# Patient Record
Sex: Female | Born: 1980 | Hispanic: Yes | Marital: Married | Smoking: Never smoker
Health system: Southern US, Community
[De-identification: ages and names within clinical notes are randomized; demographics above are authoritative.]

## PROBLEM LIST (undated history)

## (undated) DIAGNOSIS — O24419 Gestational diabetes mellitus in pregnancy, unspecified control: Secondary | ICD-10-CM

## (undated) DIAGNOSIS — Z789 Other specified health status: Secondary | ICD-10-CM

## (undated) DIAGNOSIS — Z8632 Personal history of gestational diabetes: Secondary | ICD-10-CM

## (undated) DIAGNOSIS — A6000 Herpesviral infection of urogenital system, unspecified: Secondary | ICD-10-CM

## (undated) DIAGNOSIS — Z87440 Personal history of urinary (tract) infections: Secondary | ICD-10-CM

## (undated) HISTORY — DX: Personal history of gestational diabetes: Z86.32

## (undated) HISTORY — DX: Personal history of urinary (tract) infections: Z87.440

## (undated) HISTORY — DX: Gestational diabetes mellitus in pregnancy, unspecified control: O24.419

## (undated) HISTORY — DX: Herpesviral infection of urogenital system, unspecified: A60.00

## (undated) HISTORY — PX: TUBAL LIGATION: SHX77

## (undated) HISTORY — DX: Other specified health status: Z78.9

---

## 2008-05-30 ENCOUNTER — Emergency Department: Payer: Self-pay | Admitting: Emergency Medicine

## 2014-05-12 ENCOUNTER — Emergency Department: Payer: Self-pay | Admitting: Emergency Medicine

## 2014-08-31 ENCOUNTER — Encounter: Payer: Self-pay | Admitting: Physician Assistant

## 2014-09-02 ENCOUNTER — Encounter: Payer: Self-pay | Admitting: Physician Assistant

## 2014-09-13 ENCOUNTER — Emergency Department: Payer: Self-pay | Admitting: Emergency Medicine

## 2014-10-03 ENCOUNTER — Encounter: Payer: Self-pay | Admitting: Physician Assistant

## 2018-03-05 ENCOUNTER — Ambulatory Visit
Admission: RE | Admit: 2018-03-05 | Discharge: 2018-03-05 | Disposition: A | Discharge status: Home / Self Care | Payer: Self-pay | Source: Ambulatory Visit | Attending: Physician Assistant | Admitting: Physician Assistant

## 2018-03-05 ENCOUNTER — Other Ambulatory Visit: Payer: Self-pay | Admitting: Physician Assistant

## 2018-03-05 DIAGNOSIS — O09511 Supervision of elderly primigravida, first trimester: Secondary | ICD-10-CM | POA: Insufficient documentation

## 2018-03-05 DIAGNOSIS — O26851 Spotting complicating pregnancy, first trimester: Secondary | ICD-10-CM | POA: Insufficient documentation

## 2018-03-05 DIAGNOSIS — Z3A01 Less than 8 weeks gestation of pregnancy: Secondary | ICD-10-CM | POA: Insufficient documentation

## 2018-12-24 DIAGNOSIS — A6000 Herpesviral infection of urogenital system, unspecified: Secondary | ICD-10-CM | POA: Insufficient documentation

## 2020-02-03 IMAGING — US US OB < 14 WEEKS - US OB TV
1 series · 13 of 28 positions shown · non-contrast
Comparison: None.

CLINICAL DATA: Spotting complicating pregnancy in first trimester.
Gestational age by LMP is 9 weeks 5 days.

EXAM:
OBSTETRIC <14 WK US AND TRANSVAGINAL OB US
TECHNIQUE: Both transabdominal and transvaginal ultrasound examinations were
performed for complete evaluation of the gestation as well as the
maternal uterus, adnexal regions, and pelvic cul-de-sac.
Transvaginal technique was performed to assess early pregnancy.

[Series 1: us ob < 14 weeks - us ob tv · 89 acquisitions, 13 frames shown]
[im 4/89]
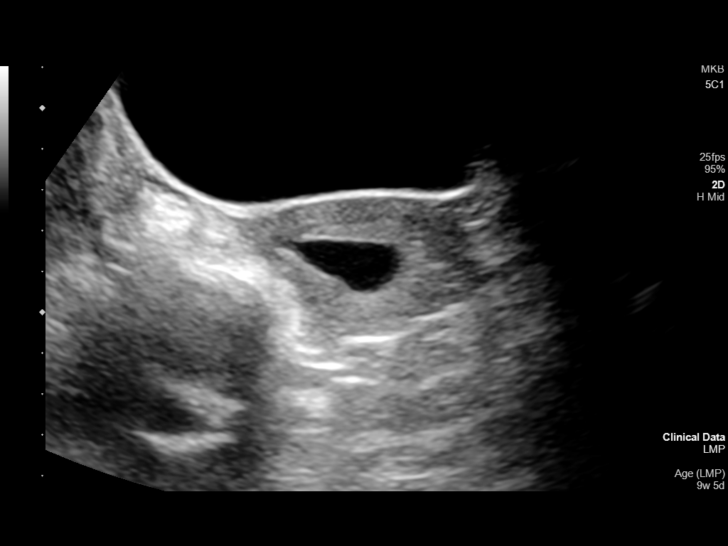
[im 10/89]
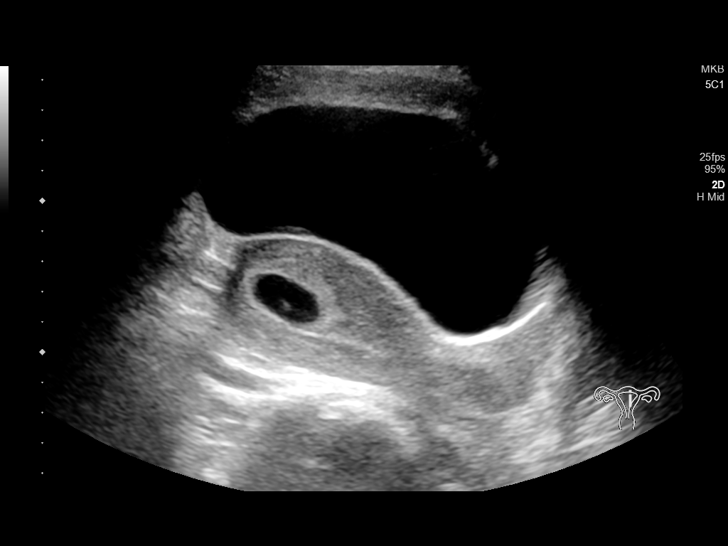
[im 17/89]
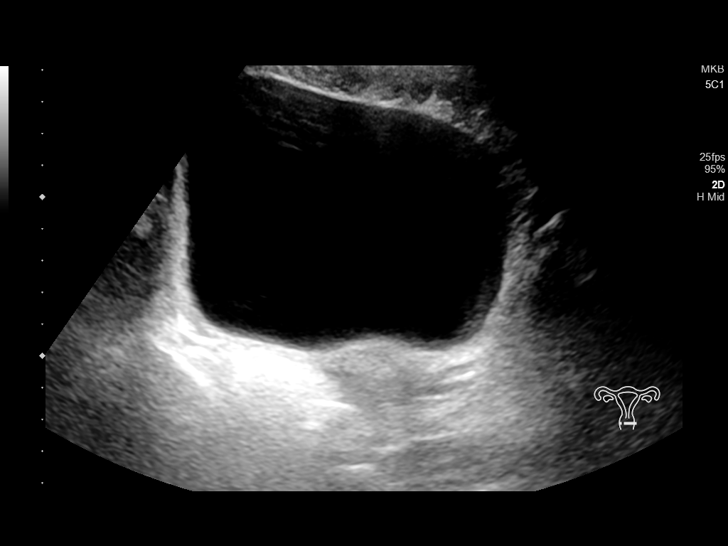
[im 23/89]
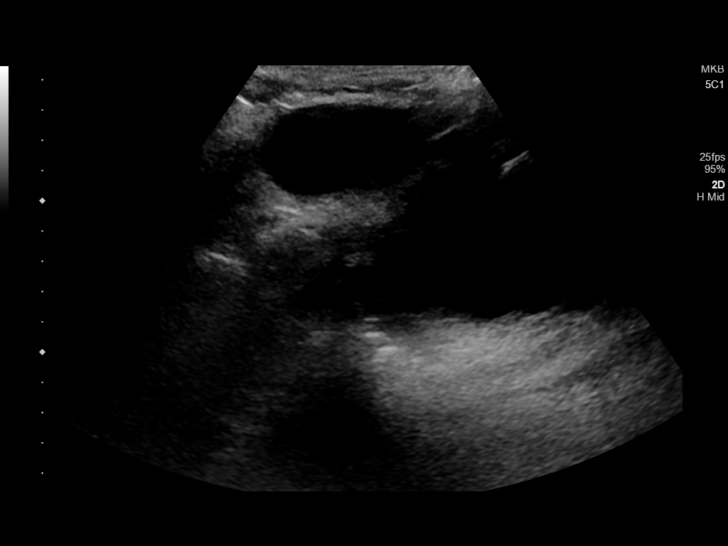
[im 30/89]
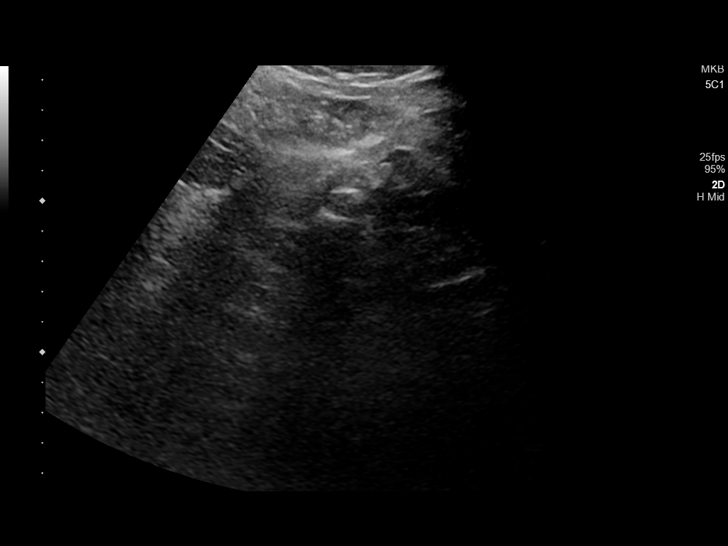
[im 36/89]
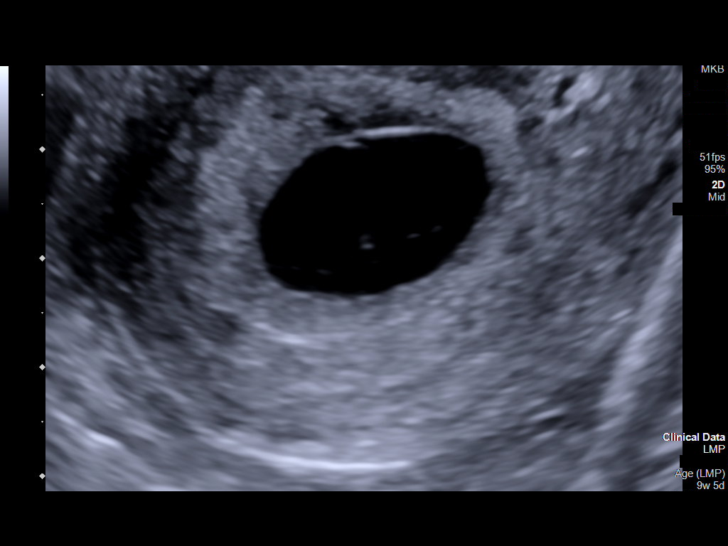
[im 46/89]
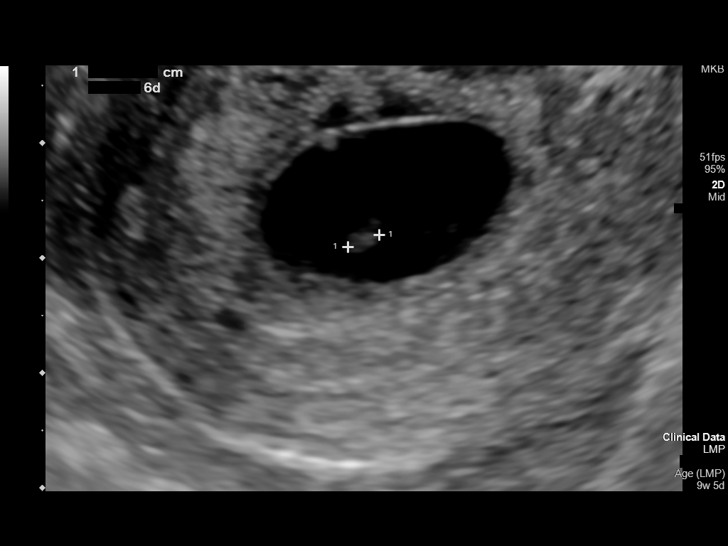
[im 53/89]
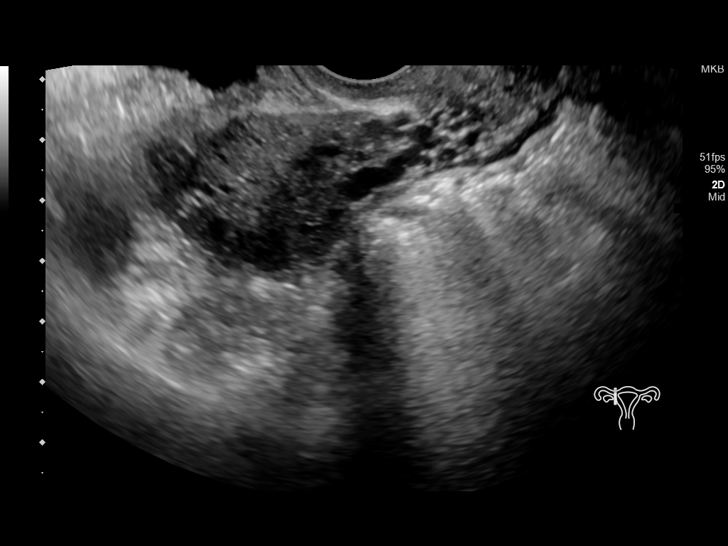
[im 59/89]
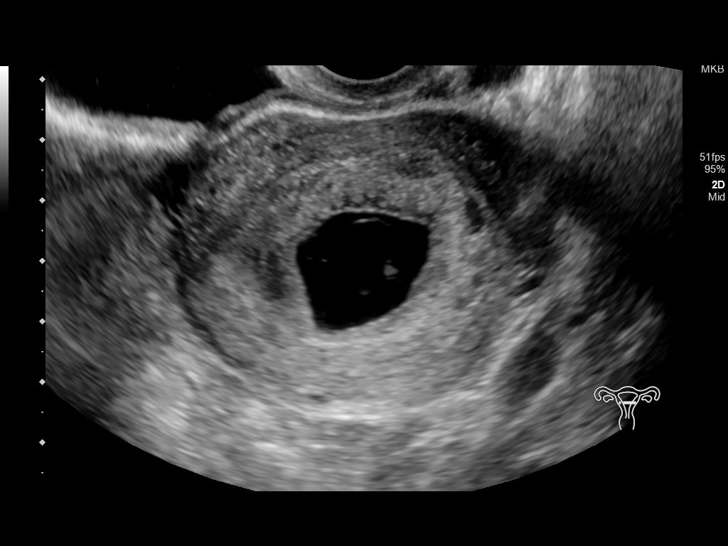
[im 66/89]
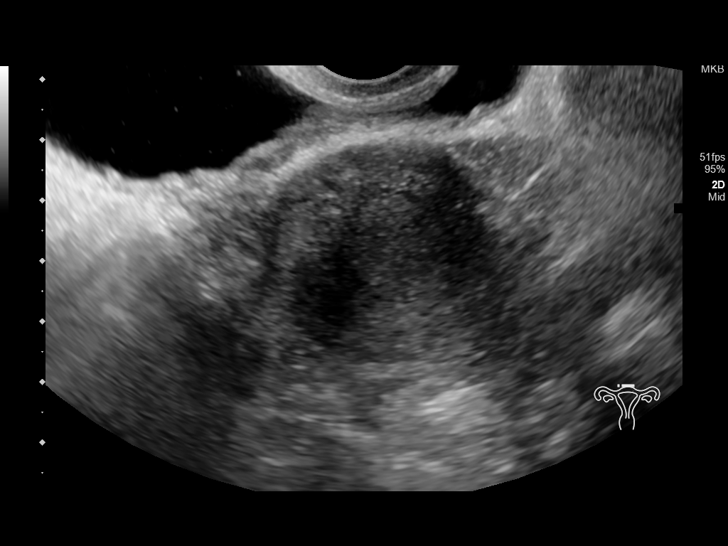
[im 72/89]
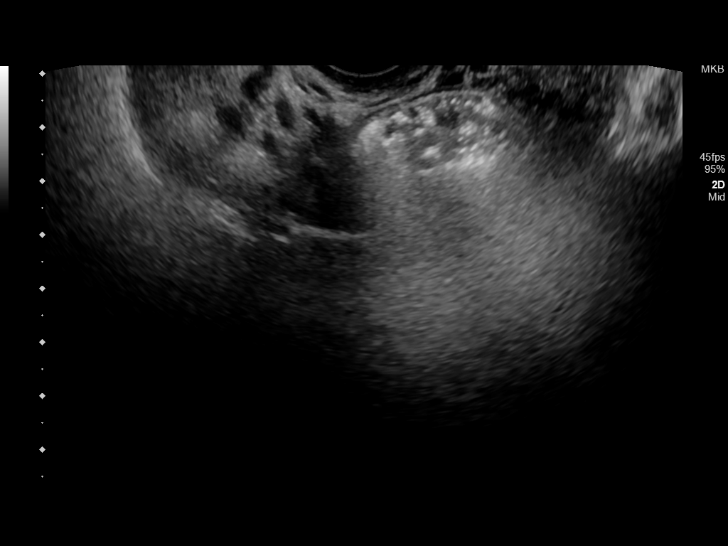
[im 79/89]
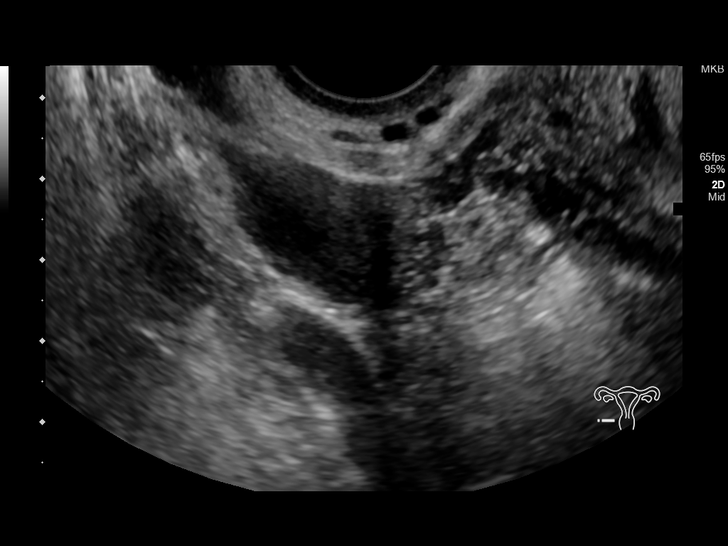
[im 85/89]
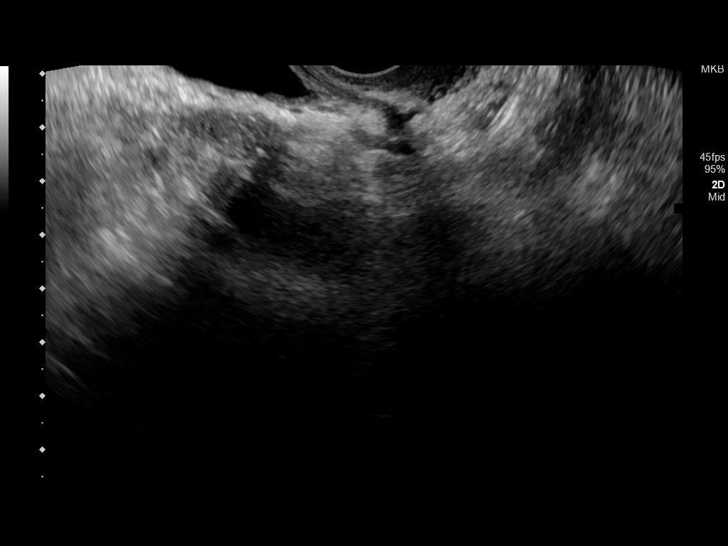

[13 of 28 positions shown; findings below may reference images not displayed]

FINDINGS: Intrauterine gestational sac: Single

Yolk sac:  Present

Embryo:  Present

Cardiac Activity: Not detected

CRL: 2.9 mm   5 w   6 d

Subchorionic hemorrhage:  None visualized.

Maternal uterus/adnexae: Uterus is of normal size and echotexture.
Trace fluid is present within the cervix. Cervix remains elongated.
A probable corpus luteal cyst is present in the right ovary
measuring up to 1.9 cm. The left ovary is not located. No
significant free fluid is present.
IMPRESSION: 1. Single intrauterine pregnancy with measurements suggesting an
estimated gestational age of 5 weeks and 6 days.
2. This is discordant from the estimated gestational age based on
last menstrual.
3. The differential diagnosis includes normal early pregnancy versus
missed abortion. Recommend follow-up quantitative B-HCG levels and
follow-up US in 14 days to confirm and assess viability. This
recommendation follows SRU consensus guidelines: Diagnostic Criteria
for Nonviable Pregnancy Early in the First Trimester. N Engl J Med

## 2020-04-15 ENCOUNTER — Emergency Department: Payer: BC Managed Care – PPO

## 2020-04-15 ENCOUNTER — Emergency Department
Admission: EM | Admit: 2020-04-15 | Discharge: 2020-04-16 | Disposition: A | Discharge status: Home / Self Care | Payer: BC Managed Care – PPO | Attending: Emergency Medicine | Admitting: Emergency Medicine

## 2020-04-15 ENCOUNTER — Other Ambulatory Visit: Payer: Self-pay

## 2020-04-15 DIAGNOSIS — O200 Threatened abortion: Secondary | ICD-10-CM | POA: Insufficient documentation

## 2020-04-15 DIAGNOSIS — Z3A01 Less than 8 weeks gestation of pregnancy: Secondary | ICD-10-CM | POA: Diagnosis not present

## 2020-04-15 DIAGNOSIS — R1030 Lower abdominal pain, unspecified: Secondary | ICD-10-CM | POA: Insufficient documentation

## 2020-04-15 DIAGNOSIS — Z2913 Encounter for prophylactic Rho(D) immune globulin: Secondary | ICD-10-CM | POA: Diagnosis not present

## 2020-04-15 DIAGNOSIS — O209 Hemorrhage in early pregnancy, unspecified: Secondary | ICD-10-CM | POA: Diagnosis present

## 2020-04-15 LAB — CBC
HCT: 35.7 % — ABNORMAL LOW (ref 36.0–46.0)
Hemoglobin: 12.3 g/dL (ref 12.0–15.0)
MCH: 30.6 pg (ref 26.0–34.0)
MCHC: 34.5 g/dL (ref 30.0–36.0)
MCV: 88.8 fL (ref 80.0–100.0)
Platelets: 313 10*3/uL (ref 150–400)
RBC: 4.02 MIL/uL (ref 3.87–5.11)
RDW: 12.9 % (ref 11.5–15.5)
WBC: 10 10*3/uL (ref 4.0–10.5)
nRBC: 0 % (ref 0.0–0.2)

## 2020-04-15 LAB — BASIC METABOLIC PANEL
Anion gap: 7 (ref 5–15)
BUN: 16 mg/dL (ref 6–20)
CO2: 25 mmol/L (ref 22–32)
Calcium: 8.7 mg/dL — ABNORMAL LOW (ref 8.9–10.3)
Chloride: 104 mmol/L (ref 98–111)
Creatinine, Ser: 0.69 mg/dL (ref 0.44–1.00)
GFR calc Af Amer: >60 mL/min (ref >60)
GFR calc non Af Amer: >60 mL/min (ref >60)
Glucose, Bld: 118 mg/dL — ABNORMAL HIGH (ref 70–99)
Potassium: 4 mmol/L (ref 3.5–5.1)
Sodium: 136 mmol/L (ref 135–145)

## 2020-04-15 LAB — HCG, QUANTITATIVE, PREGNANCY: hCG, Beta Chain, Quant, S: 14921 m[IU]/mL — ABNORMAL HIGH (ref <5)

## 2020-04-15 LAB — POCT PREGNANCY, URINE: Preg Test, Ur: POSITIVE — AB

## 2020-04-15 LAB — ABO/RH: ABO/RH(D): O NEG

## 2020-04-15 LAB — ANTIBODY SCREEN: Antibody Screen: NEGATIVE

## 2020-04-15 MED ORDER — RHO D IMMUNE GLOBULIN 1500 UNIT/2ML IJ SOSY
300.0000 ug | PREFILLED_SYRINGE | Freq: Once | INTRAMUSCULAR | Status: AC
  Administered 2020-04-15: 300 ug via INTRAMUSCULAR
  Filled 2020-04-15: qty 2

## 2020-04-15 NOTE — ED Notes (Signed)
POC pregnancy test positive.

## 2020-04-15 NOTE — ED Triage Notes (Signed)
First RN Note: Pt presents to ED via POV, states had + at home pregnancy test. Pt's SO states that have opposite RH factors and she "would need the shot" if she is.

## 2020-04-15 NOTE — ED Triage Notes (Addendum)
Pt ambulatory from triage with steady gait. Pt reports that she thinks she may be pregnant because she took 3 tests that were positive. Pt reports pinkish vaginal fluid that resembles blood that occurred today accompanied with abdominal pain. LMP 02/22/20

## 2020-04-15 NOTE — ED Provider Notes (Signed)
[facility] Emergency Department Provider Note ____________________________________________   First MD Initiated Contact with Patient 04/15/20 2030     (approximate)  I have reviewed the triage vital signs and the nursing notes.   HISTORY  Chief Complaint Vaginal Bleeding    HPI Tonesha Tsou is a 39 y.o. female G3P1 at approximately 7 weeks by dates who presents with vaginal bleeding today, acute onset, described as pinkish blood and a relatively small amount.  She has mild associated lower abdominal pain.  She denies any weakness or lightheadedness, nausea or vomiting, or fever.  History reviewed. No pertinent past medical history.  There are no problems to display for this patient.     Prior to Admission medications   Not on File    Allergies Patient has no allergy information on record.  History reviewed. No pertinent family history.  Social History Social History   Tobacco Use  . Smoking status: Not on file  Substance Use Topics  . Alcohol use: Not on file  . Drug use: Not on file    Review of Systems  Constitutional: No fever. Eyes: No redness. ENT: No sore throat. Cardiovascular: Denies chest pain. Respiratory: Denies shortness of breath. Gastrointestinal: No vomiting. Genitourinary: Positive for vaginal bleeding. Musculoskeletal: Negative for back pain. Skin: Negative for rash. Neurological: Negative for headache.   ____________________________________________   PHYSICAL EXAM:  VITAL SIGNS: ED Triage Vitals  Enc Vitals Group     BP 04/15/20 1724 (!) 121/53     Pulse Rate 04/15/20 1724 74     Resp 04/15/20 1724 18     Temp 04/15/20 1724 98.4 F (36.9 C)     Temp Source 04/15/20 1724 Oral     SpO2 04/15/20 1724 100 %     Weight 04/15/20 1722 114 lb (51.7 kg)     Height 04/15/20 1722 4\' 6"  (1.372 m)     Head Circumference --      Peak Flow --      Pain Score 04/15/20 1722 2     Pain Loc --      Pain  Edu? --      Excl. in [place]? --     Constitutional: Alert and oriented. Well appearing and in no acute distress. Eyes: Conjunctivae are normal.  Head: Atraumatic. Nose: No congestion/rhinnorhea. Mouth/Throat: Mucous membranes are moist.   Neck: Normal range of motion.  Cardiovascular: Normal rate, regular rhythm.  Good peripheral circulation. Respiratory: Normal respiratory effort.  No retractions.  Gastrointestinal: Soft and nontender. No distention.  Genitourinary: No CVA tenderness. Musculoskeletal: Extremities warm and well perfused.  Neurologic:  Normal speech and language. No gross focal neurologic deficits are appreciated.  Skin:  Skin is warm and dry. No rash noted. Psychiatric: Mood and affect are normal. Speech and behavior are normal.  ____________________________________________   LABS (all labs ordered are listed, but only abnormal results are displayed)  Labs Reviewed  CBC - Abnormal; Notable for the following components:      Result Value   HCT 35.7 (*)    All other components within normal limits  BASIC METABOLIC PANEL - Abnormal; Notable for the following components:   Glucose, Bld 118 (*)    Calcium 8.7 (*)    All other components within normal limits  HCG, QUANTITATIVE, PREGNANCY - Abnormal; Notable for the following components:   hCG, Beta Chain, Quant, S 14,921 (*)    All other components within normal limits  POCT PREGNANCY, URINE - Abnormal; Notable  for the following components:   Preg Test, Ur POSITIVE (*)    All other components within normal limits  POC URINE PREG, ED  ABO/RH  ANTIBODY SCREEN  RHOGAM INJECTION   ____________________________________________  EKG   ____________________________________________  RADIOLOGY  US pelvis: Gestational sac with no yolk sac or fetal pole  ____________________________________________   PROCEDURES  Procedure(s) performed: No  Procedures  Critical Care performed:  No ____________________________________________   INITIAL IMPRESSION / ASSESSMENT AND PLAN / ED COURSE  Pertinent labs & imaging results that were available during my care of the patient were reviewed by me and considered in my medical decision making (see chart for details).  39 year old female G3P1 at 7 weeks by LMP presents with vaginal bleeding today associated with some crampy lower abdominal pain.  On exam the patient is well-appearing and her vital signs are normal.  The abdomen is soft and nontender.  Initial lab work-up is unremarkable.  The patient has a positive urine pregnancy.  We will obtain a quantitative hCG.  She is Rh negative.  Differential includes threatened or inevitable miscarriage versus less likely ectopic.  ----------------------------------------- 11:25 PM on 04/15/2020 -----------------------------------------  Ultrasound shows gestational sac with no yolk sac or fetal pole, thus no definitive IUP.  The patient continues to appear comfortable, and there is no clinical evidence for ectopic.  I consulted Dr. Jean Rosenthal from OB/GYN who recommended that the patient follow-up with him early next week.  I ordered the RhoGam and I have counseled the patient on the results of the work-up and the follow-up plan.  I gave her thorough return precautions and she expressed understanding.  ____________________________________________   FINAL CLINICAL IMPRESSION(S) / ED DIAGNOSES  Final diagnoses:  Threatened miscarriage      NEW MEDICATIONS STARTED DURING THIS VISIT:  New Prescriptions   No medications on file     Note:  This document was prepared using Dragon voice recognition software and may include unintentional dictation errors.   Dionne Bucy, MD 04/15/20 2325

## 2020-04-15 NOTE — Discharge Instructions (Signed)
Call [facility] on Monday to schedule appointment as soon as possible next week.  Tell them you were seen in the ER and need follow-up.  In the meantime, return to the ER for new, worsening, or persistent severe bleeding, pain, weakness or lightheadedness, or any other new or worsening symptoms that concern you.

## 2020-04-17 LAB — RHOGAM INJECTION: Unit division: 0

## 2020-04-22 ENCOUNTER — Ambulatory Visit (INDEPENDENT_AMBULATORY_CARE_PROVIDER_SITE_OTHER): Payer: BC Managed Care – PPO | Admitting: Obstetrics and Gynecology

## 2020-04-22 ENCOUNTER — Other Ambulatory Visit: Payer: Self-pay

## 2020-04-22 ENCOUNTER — Encounter: Payer: Self-pay | Admitting: Obstetrics and Gynecology

## 2020-04-22 VITALS — BP 122/74 | Wt 117.0 lb

## 2020-04-22 DIAGNOSIS — O200 Threatened abortion: Secondary | ICD-10-CM

## 2020-04-22 NOTE — Progress Notes (Signed)
Obstetrics & Gynecology Office Visit   Chief Complaint  Patient presents with  . Follow-up  ER visit for vaginal bleeding in the setting of pregnancy  History of Present Illness: 39 y.o. G3P1011 female at about [redacted] weeks gestation.  She notes that she was pregnant and started bleeding one week ago. She has continued to bleed since that time.  She had a pelvic ultrasound that showed a gestational sac with no yolk sac nor fetal pole.  Her LMP was 02/22/2020, giving her an estimated gestational age of [redacted]w[redacted]d and an EDD of 11/28/2020.  Her bleeding is red and sometimes clots.  She notes a little bit of occasional mild cramping and lower back pain. She has continued to work.  She has a 57-month old son.  He was delivered by C-section.  G1 was a miscarriage at about 3 months gestation.  She had been using no form of contraception.  Her blood type is O negative. She received rhogam in the ER last week.  Her hCG at that time was 14,921.    Past Medical History:  Diagnosis Date  . No known health problems    Past Surgical History:  Procedure Laterality Date  . CESAREAN SECTION     Gynecologic History: Patient's last menstrual period was 02/22/2020.  Obstetric History: G3P1011  Family History  Problem Relation Age of Onset  . Breast cancer Neg Hx   . Diabetes Neg Hx   . Hypertension Neg Hx   . Miscarriages / Stillbirths Neg Hx   . Ovarian cancer Neg Hx     Social History   Socioeconomic History  . Marital status: Single    Spouse name: Not on file  . Number of children: Not on file  . Years of education: Not on file  . Highest education level: Not on file  Occupational History  . Not on file  Tobacco Use  . Smoking status: Never Smoker  . Smokeless tobacco: Never Used  Substance and Sexual Activity  . Alcohol use: Never  . Drug use: Never  . Sexual activity: Yes    Birth control/protection: None  Other Topics Concern  . Not on file  Social History Narrative  . Not on file    Social Determinants of Health   Financial Resource Strain:   . Difficulty of Paying Living Expenses:   Food Insecurity:   . Worried About Programme researcher, broadcasting/film/video in the Last Year:   . Barista in the Last Year:   Transportation Needs:   . Freight forwarder (Medical):   Marland Kitchen Lack of Transportation (Non-Medical):   Physical Activity:   . Days of Exercise per Week:   . Minutes of Exercise per Session:   Stress:   . Feeling of Stress :   Social Connections:   . Frequency of Communication with Friends and Family:   . Frequency of Social Gatherings with Friends and Family:   . Attends Religious Services:   . Active Member of Clubs or Organizations:   . Attends Banker Meetings:   Marland Kitchen Marital Status:   Intimate Partner Violence:   . Fear of Current or Ex-Partner:   . Emotionally Abused:   Marland Kitchen Physically Abused:   . Sexually Abused:    Allergies: No Known Allergies  Prior to Admission medications   Denies   Review of Systems  Constitutional: Negative.   HENT: Negative.   Eyes: Negative.   Respiratory: Negative.   Cardiovascular: Negative.   Gastrointestinal:  Negative.   Genitourinary: Negative.   Musculoskeletal: Positive for back pain. Negative for falls, joint pain, myalgias and neck pain.  Skin: Negative.   Neurological: Negative.   Psychiatric/Behavioral: Negative.      Physical Exam BP 122/74   Wt 117 lb (53.1 kg)   LMP 02/22/2020   BMI 28.21 kg/m  Patient's last menstrual period was 02/22/2020. Physical Exam Constitutional:      General: She is not in acute distress.    Appearance: Normal appearance.  HENT:     Head: Normocephalic and atraumatic.  Eyes:     General: No scleral icterus.    Conjunctiva/sclera: Conjunctivae normal.  Neurological:     General: No focal deficit present.     Mental Status: She is alert and oriented to person, place, and time.     Cranial Nerves: No cranial nerve deficit.  Psychiatric:        Mood and  Affect: Mood normal.        Behavior: Behavior normal.        Judgment: Judgment normal.     Female chaperone present for pelvic and breast  portions of the physical exam  Assessment: 39 y.o. G47P1011 female here for  1. Threatened abortion      Plan: Problem List Items Addressed This Visit    None    Visit Diagnoses    Threatened abortion    -  Primary   Relevant Orders   Beta hCG quant (ref lab)     Condolences were offered to the patient and her family.  I stressed that while emotionally difficult, that this did not occur because of an actions or inactions by the patient.  Somewhere between 10-20% of identified first trimester pregnancies will unfortunately end in miscarriage.  Given this relatively high incidence rate, further diagnostic testing such as chromosome analysis is generally not clinically relevant nor recommended.  Although the chromosomal abnormalities have been implicated at rates as high as 70% in some studies, these are generally random and do not infer and increased risk of recurrence with subsequent pregnancies.  However, 3 or more consecutive first trimester losses are relatively uncommon, and these patient generally do benefit from additional work up to determine a potential modifiable etiology.   We briefly discussed management options including expectant management, medical management, and surgical management as well as their relative success rates and complications. Approximately 80% of first trimester miscarriages will pass successfully but may require a time frame of up to 8 weeks (ACOG Practice Bulletin 150 May 2015 "Early Pregnancy Loss").  Medical management using of misoprostil administered every 3-hrs as needed for up to 3 doses speeds up the time frame to completion significantly, has literature supporting its use up to 63 days or [redacted]w[redacted]d gestation and results in a passage rate of 84-85% (ACOG Practice Bulletin 143 March 2014 "Medical Management of  First-Trimester Abortion").  Dilation and curettage has the highest rate of uterine evacuation, but carries with is operative cost, surgical and anesthetic risk.  While these risk are relatively small they nevertheless include infection, bleeding, uterine perforation, formation of uterine synechia, and in rare cases death.   We discussed repeat ultrasound and or trending HCG levels if the patient wishes to pursue these prior to making her decision.  Clinically I am confident of the diagnosis, but I do not want any doubts in the patient's mind regarding the plan of management she chooses to adopt.  I will allow the patient and her family to  discuss management options and she was advised to contact the office to arrange final disposition one she has made her decision or should she have any follow up questions for myself.    The patient is Rh negative and rhogam is therefore indicated and has already been given.    HCG drawn today to see the trend of what is happening in her pregnancy. Follow up will be based on this.   A total of 35 minutes were spent face-to-face with the patient as well as preparation, review, communication, and documentation during this encounter.    Prentice Docker, MD 04/22/2020 3:10 PM

## 2020-04-23 LAB — BETA HCG QUANT (REF LAB): hCG Quant: 14100 m[IU]/mL

## 2020-04-25 NOTE — Telephone Encounter (Signed)
Can wait

## 2020-04-26 ENCOUNTER — Other Ambulatory Visit: Payer: Self-pay | Admitting: Obstetrics and Gynecology

## 2020-04-26 DIAGNOSIS — O200 Threatened abortion: Secondary | ICD-10-CM

## 2020-04-28 ENCOUNTER — Other Ambulatory Visit: Payer: Self-pay

## 2020-04-28 ENCOUNTER — Ambulatory Visit (INDEPENDENT_AMBULATORY_CARE_PROVIDER_SITE_OTHER): Payer: BC Managed Care – PPO

## 2020-04-28 ENCOUNTER — Other Ambulatory Visit: Payer: Self-pay | Admitting: Obstetrics and Gynecology

## 2020-04-28 DIAGNOSIS — O200 Threatened abortion: Secondary | ICD-10-CM | POA: Diagnosis not present

## 2020-04-29 ENCOUNTER — Ambulatory Visit (INDEPENDENT_AMBULATORY_CARE_PROVIDER_SITE_OTHER): Payer: BC Managed Care – PPO | Admitting: Obstetrics and Gynecology

## 2020-04-29 ENCOUNTER — Encounter: Payer: Self-pay | Admitting: Obstetrics and Gynecology

## 2020-04-29 VITALS — BP 112/74 | Wt 117.0 lb

## 2020-04-29 DIAGNOSIS — O200 Threatened abortion: Secondary | ICD-10-CM | POA: Diagnosis not present

## 2020-04-29 NOTE — Progress Notes (Signed)
Gynecology Ultrasound Follow Up   Chief Complaint  Patient presents with  . Follow-up  U/s for dating/viability   History of Present Illness: Patient is a 39 y.o. female who presents today for ultrasound evaluation of the above .  Briefly: the patient is pregnant, LMP mid-March.  She had vaginal bleeding and wen to ER on 04/15/20 where she had a beta hCG of 14,900.  She is Rh negative and received rhogam. She had a pelvic ultrasound which showed on a yolk sac. She was seen here in clinic on 04/22/20.  She had a beta hCG of 14,100 on that date.  She presents today after having an ultrasound yesterday (results below). She has been having some intermittent bleeding with occasional clots.   Ultrasound demonstrates the following findings Adnexa: no masses seen  Uterus: anteverted with a gestational sac and possibly a yolk sac, but this appeared misshapen. No fetal pole noted.    Past Medical History:  Diagnosis Date  . No known health problems     Past Surgical History:  Procedure Laterality Date  . CESAREAN SECTION      Family History  Problem Relation Age of Onset  . Breast cancer Neg Hx   . Diabetes Neg Hx   . Hypertension Neg Hx   . Miscarriages / Stillbirths Neg Hx   . Ovarian cancer Neg Hx     Social History   Socioeconomic History  . Marital status: Single    Spouse name: Not on file  . Number of children: Not on file  . Years of education: Not on file  . Highest education level: Not on file  Occupational History  . Not on file  Tobacco Use  . Smoking status: Never Smoker  . Smokeless tobacco: Never Used  Substance and Sexual Activity  . Alcohol use: Never  . Drug use: Never  . Sexual activity: Yes    Birth control/protection: None  Other Topics Concern  . Not on file  Social History Narrative  . Not on file   Social Determinants of Health   Financial Resource Strain:   . Difficulty of Paying Living Expenses:   Food Insecurity:   . Worried About  Programme researcher, broadcasting/film/video in the Last Year:   . Barista in the Last Year:   Transportation Needs:   . Freight forwarder (Medical):   Marland Kitchen Lack of Transportation (Non-Medical):   Physical Activity:   . Days of Exercise per Week:   . Minutes of Exercise per Session:   Stress:   . Feeling of Stress :   Social Connections:   . Frequency of Communication with Friends and Family:   . Frequency of Social Gatherings with Friends and Family:   . Attends Religious Services:   . Active Member of Clubs or Organizations:   . Attends Banker Meetings:   Marland Kitchen Marital Status:   Intimate Partner Violence:   . Fear of Current or Ex-Partner:   . Emotionally Abused:   Marland Kitchen Physically Abused:   . Sexually Abused:     No Known Allergies  Prior to Admission medications   Denies    Physical Exam BP 112/74   Wt 117 lb (53.1 kg)   BMI 28.21 kg/m    General: NAD HEENT: normocephalic, anicteric Pulmonary: No increased work of breathing Extremities: no edema, erythema, or tenderness Neurologic: Grossly intact, normal gait Psychiatric: mood appropriate, affect full  Imaging Results US OB Transvaginal  Result Date: 04/29/2020  Patient Name: Necha Harries DOB: [birth year] MRN: [id number] ULTRASOUND REPORT Location: [facility] Date of Service: 04/28/2020 Indications:dating and bleeding in first trimester Findings: There is a questionable irregularly shaped gestational sac seen in the uterus measuring 11.0 x 7.4 x 6.1 mm. There is a questionable yolk sac measuring 3.1 mm. There is a septation seen within this cystic area. No fetal pole nor heartbeat is seen at this time. Right Ovary is normal in appearance. Left Ovary is normal appearance. Corpus luteal cyst:  Right ovary Survey of the adnexa demonstrates no adnexal masses. There is no free peritoneal fluid in the cul de sac. Endometrial measurement is 26.5 mm. Impression: There is a questionable irregularly shaped gestational sac with  a yolk sac. No fetal pole is seen at this time. Deanna Artis, RT The ultrasound images and findings were reviewed by me and I agree with the above report. Thomasene Mohair, MD, [facility], [facility] 04/29/2020 12:35 PM        Assessment: 39 y.o. G3P1011  1. Threatened abortion      Plan: Problem List Items Addressed This Visit    None    Visit Diagnoses    Threatened abortion    -  Primary   Relevant Orders   US OB Comp Less 14 Wks     Condolences were offered to the patient and her family.  I stressed that while emotionally difficult, that this did not occur because of an actions or inactions by the patient.  Somewhere between 10-20% of identified first trimester pregnancies will unfortunately end in miscarriage.  Given this relatively high incidence rate, further diagnostic testing such as chromosome analysis is generally not clinically relevant nor recommended.  Although the chromosomal abnormalities have been implicated at rates as high as 70% in some studies, these are generally random and do not infer and increased risk of recurrence with subsequent pregnancies.  However, 3 or more consecutive first trimester losses are relatively uncommon, and these patient generally do benefit from additional work up to determine a potential modifiable etiology.   We briefly discussed management options including expectant management, medical management, and surgical management as well as their relative success rates and complications. Approximately 80% of first trimester miscarriages will pass successfully but may require a time frame of up to 8 weeks (ACOG Practice Bulletin 150 May 2015 "Early Pregnancy Loss").  Medical management using of misoprostil administered every 3-hrs as needed for up to 3 doses speeds up the time frame to completion significantly, has literature supporting its use up to 63 days or [redacted]w[redacted]d gestation and results in a passage rate of 84-85% (ACOG  Practice Bulletin 143 March 2014 "Medical Management of First-Trimester Abortion").  Dilation and curettage has the highest rate of uterine evacuation, but carries with is operative cost, surgical and anesthetic risk.  While these risk are relatively small they nevertheless include infection, bleeding, uterine perforation, formation of uterine synechia, and in rare cases death.   We discussed repeat ultrasound and or trending HCG levels if the patient wishes to pursue these prior to making her decision.  Clinically I am confident of the diagnosis, but I do not want any doubts in the patient's mind regarding the plan of management she chooses to adopt.  I will allow the patient and her family to discuss management options and she was advised to contact the office to arrange final disposition one she has made her decision or should she have any follow up  questions for myself.    The patient is Rh negative and rhogam is therefore is indicated and has already been given.    She was offered an additional ultrasound in a couple of weeks as she would like to give her body a couple of weeks to pass the pregnancy. If still present, she would like to consider either a medication treatment or surgical.  A hospital-provided medical spanish interpreter was present throughout this visit.   A total of 30 minutes were spent face-to-face with the patient as well as preparation, review, communication, and documentation during this encounter.    Prentice Docker, MD, [facility], [facility] 04/29/2020 12:40 PM

## 2020-05-09 ENCOUNTER — Ambulatory Visit (INDEPENDENT_AMBULATORY_CARE_PROVIDER_SITE_OTHER): Payer: BC Managed Care – PPO

## 2020-05-09 ENCOUNTER — Other Ambulatory Visit: Payer: Self-pay

## 2020-05-09 ENCOUNTER — Ambulatory Visit (INDEPENDENT_AMBULATORY_CARE_PROVIDER_SITE_OTHER): Payer: BC Managed Care – PPO | Admitting: Obstetrics and Gynecology

## 2020-05-09 ENCOUNTER — Encounter: Payer: Self-pay | Admitting: Obstetrics and Gynecology

## 2020-05-09 VITALS — BP 118/74 | Wt 117.0 lb

## 2020-05-09 DIAGNOSIS — O020 Blighted ovum and nonhydatidiform mole: Secondary | ICD-10-CM | POA: Diagnosis not present

## 2020-05-09 DIAGNOSIS — O200 Threatened abortion: Secondary | ICD-10-CM

## 2020-05-09 MED ORDER — MISOPROSTOL 200 MCG PO TABS: ORAL_TABLET | ORAL | 0 refills | Status: DC

## 2020-05-09 MED ORDER — ONDANSETRON 4 MG PO TBDP: 4.0000 mg | ORAL_TABLET | Freq: Four times a day (QID) | ORAL | 0 refills | Status: DC | PRN

## 2020-05-09 NOTE — Progress Notes (Signed)
Gynecology Ultrasound Follow Up   Chief Complaint  Patient presents with  . Follow-up  Ultrasound for pregnancy viability  History of Present Illness: Patient is a 39 y.o. female who presents today for ultrasound evaluation of the above .  Ultrasound demonstrates the following findings Adnexa: no masses seen  Uterus: anteverted with small gestational sac, no fetal pole or yolk sac.  Additional: no abnormalities noted.  She notes some ongoing bleeding, then not much bleeding. Denies cramping.   Past Medical History:  Diagnosis Date  . No known health problems     Past Surgical History:  Procedure Laterality Date  . CESAREAN SECTION      Family History  Problem Relation Age of Onset  . Breast cancer Neg Hx   . Diabetes Neg Hx   . Hypertension Neg Hx   . Miscarriages / Stillbirths Neg Hx   . Ovarian cancer Neg Hx     Social History   Socioeconomic History  . Marital status: Single    Spouse name: Not on file  . Number of children: Not on file  . Years of education: Not on file  . Highest education level: Not on file  Occupational History  . Not on file  Tobacco Use  . Smoking status: Never Smoker  . Smokeless tobacco: Never Used  Substance and Sexual Activity  . Alcohol use: Never  . Drug use: Never  . Sexual activity: Yes    Birth control/protection: None  Other Topics Concern  . Not on file  Social History Narrative  . Not on file   Social Determinants of Health   Financial Resource Strain:   . Difficulty of Paying Living Expenses:   Food Insecurity:   . Worried About Charity fundraiser in the Last Year:   . Arboriculturist in the Last Year:   Transportation Needs:   . Film/video editor (Medical):   Marland Kitchen Lack of Transportation (Non-Medical):   Physical Activity:   . Days of Exercise per Week:   . Minutes of Exercise per Session:   Stress:   . Feeling of Stress :   Social Connections:   . Frequency of Communication with Friends and  Family:   . Frequency of Social Gatherings with Friends and Family:   . Attends Religious Services:   . Active Member of Clubs or Organizations:   . Attends Archivist Meetings:   Marland Kitchen Marital Status:   Intimate Partner Violence:   . Fear of Current or Ex-Partner:   . Emotionally Abused:   Marland Kitchen Physically Abused:   . Sexually Abused:     No Known Allergies  Prior to Admission medications   Denies    Physical Exam BP 118/74   Wt 117 lb (53.1 kg)   BMI 28.21 kg/m    General: NAD HEENT: normocephalic, anicteric Pulmonary: No increased work of breathing Extremities: no edema, erythema, or tenderness Neurologic: Grossly intact, normal gait Psychiatric: mood appropriate, affect full  Imaging Results US OB Comp Less 14 Wks  Result Date: 05/09/2020 Patient Name: Jyll Tomaro DOB: [birth year] MRN: [id number] ULTRASOUND REPORT Location: [facility] Date of Service: 05/09/2020 Indications:Threatened AB Findings: There is a single gestational sac seen measuring 8.2 mm. No fetal pole, heartbeat, or yolk sac is seen. The (U/S) EDD is not consistent with the clinically established EDD of 11/28/2020. Right Ovary is normal in appearance. Left Ovary is normal appearance. Corpus luteal cyst:  Right ovary Survey of the adnexa demonstrates  no adnexal masses. There is no free peritoneal fluid in the cul de sac. Impression: 1. There is a single gestational sac seen within the uterus. No fetal pole, heartbeat or yolk sac is seen. 2. This is consistent with a failed pregnancy.Deanna Artis, RT *Criteria from the Society of Radiologists in Ultrasound Multispecialty Consensus Conference on Early First Trimester Diagnosis of Miscarriage and Exclusion of a Viable Intrauterine Pregnancy, October 2012. The ultrasound images and findings were reviewed by me and I agree with the above report. Thomasene Mohair, MD, [facility], [facility] 05/09/2020 4:16 PM        Assessment: 39 y.o. G3P1011  1. Anembryonic pregnancy      Plan: Problem List Items Addressed This Visit    None    Visit Diagnoses    Anembryonic pregnancy    -  Primary   Relevant Medications   misoprostol (CYTOTEC) 200 MCG tablet   ondansetron (ZOFRAN ODT) 4 MG disintegrating tablet     Discussed ultrasound findings today.  She has an anembryonic pregnancy. Again reviewed options. She would like to try using medication to help pass the pregnancy.  Prescription provided along with instructions.  She will let me know if she still has a positive pregnancy test 2 weeks after taking this medication.   She would also like contraception. Will provide Rx for combined OCPs once she has completed her miscarriage.   Spanish language interpreter present (hospital provided medical spanish language interpreter).  A total of 25 minutes were spent face-to-face with the patient as well as preparation, review, communication, and documentation during this encounter.    Thomasene Mohair, MD, [facility], [facility] 05/09/2020 5:32 PM

## 2020-05-23 ENCOUNTER — Telehealth: Payer: Self-pay

## 2020-05-23 NOTE — Telephone Encounter (Signed)
Can you ask her if she took the medication once or if she also took a repeat dose?  I would recommend she complete two doses and if no response, we get an ultrasound to verify that the tissue hasn't passed.  Then, decide what to do from there.

## 2020-05-23 NOTE — Telephone Encounter (Signed)
Ariana Hall, can you call this pt. The triage message was in english, but not sure if someone else was calling for her. Chart states needs interpreter.

## 2020-05-23 NOTE — Telephone Encounter (Signed)
Pt did take both doses. Says she has been spotting all this time, no period flow/no cramping. Has had positive UPT's on 6/12 and 6/21.

## 2020-05-23 NOTE — Telephone Encounter (Signed)
Pt called triage line stating Dr. Jean Rosenthal gave her a medication to help pass a miscarriage. She states it hasn't worked because she took a pregnancy test and it was positive. CB# [phone]

## 2020-05-23 NOTE — Telephone Encounter (Signed)
Please advise 

## 2020-05-23 NOTE — Telephone Encounter (Signed)
Called pt, no answer, LVMTRC. 

## 2020-05-24 ENCOUNTER — Other Ambulatory Visit: Payer: Self-pay | Admitting: Obstetrics and Gynecology

## 2020-05-24 DIAGNOSIS — O020 Blighted ovum and nonhydatidiform mole: Secondary | ICD-10-CM

## 2020-05-24 NOTE — Telephone Encounter (Signed)
Pls let me know when you're available to call this pt.

## 2020-05-24 NOTE — Telephone Encounter (Signed)
Would you mind calling her and helping her schedule a pelvic u/s and follow up with me after? I'll put in the order for the ultrasound.  Thanks!

## 2020-05-24 NOTE — Telephone Encounter (Signed)
Patient is scheduled for 06/13/2020 at 11:00 & 11:30 for u/s and f/u with SDJ

## 2020-06-13 ENCOUNTER — Ambulatory Visit (INDEPENDENT_AMBULATORY_CARE_PROVIDER_SITE_OTHER): Payer: BC Managed Care – PPO

## 2020-06-13 ENCOUNTER — Encounter: Payer: Self-pay | Admitting: Obstetrics and Gynecology

## 2020-06-13 ENCOUNTER — Other Ambulatory Visit: Payer: Self-pay

## 2020-06-13 ENCOUNTER — Ambulatory Visit (INDEPENDENT_AMBULATORY_CARE_PROVIDER_SITE_OTHER): Payer: BC Managed Care – PPO | Admitting: Obstetrics and Gynecology

## 2020-06-13 VITALS — BP 118/74 | Wt 115.0 lb

## 2020-06-13 DIAGNOSIS — O021 Missed abortion: Secondary | ICD-10-CM | POA: Diagnosis not present

## 2020-06-13 DIAGNOSIS — O020 Blighted ovum and nonhydatidiform mole: Secondary | ICD-10-CM | POA: Diagnosis not present

## 2020-06-13 LAB — POCT URINE PREGNANCY: Preg Test, Ur: NEGATIVE

## 2020-06-13 NOTE — Addendum Note (Signed)
Addended by: Kathlene Cote on: 06/13/2020 12:53 PM   Modules accepted: Orders

## 2020-06-13 NOTE — Progress Notes (Signed)
Gynecology Ultrasound Follow Up   Chief Complaint  Patient presents with  . Follow-up  ultrasound for missed abortion   History of Present Illness: Patient is a 39 y.o. female who presents today for ultrasound evaluation of the above .  Ultrasound demonstrates the following findings Adnexa: no masses seen  Uterus: anteverted with endometrial stripe  7.2 mm Additional: echogenic material within the endometrial canal measuring 2.3 x 0.43 x 1.75 cm. There is no blood flow within this material.  She notes that she has stopped bleeding. She recently had a period.  She would like to start contraception.   Past Medical History:  Diagnosis Date  . No known health problems     Past Surgical History:  Procedure Laterality Date  . CESAREAN SECTION      Family History  Problem Relation Age of Onset  . Breast cancer Neg Hx   . Diabetes Neg Hx   . Hypertension Neg Hx   . Miscarriages / Stillbirths Neg Hx   . Ovarian cancer Neg Hx     Social History   Socioeconomic History  . Marital status: Single    Spouse name: Not on file  . Number of children: Not on file  . Years of education: Not on file  . Highest education level: Not on file  Occupational History  . Not on file  Tobacco Use  . Smoking status: Never Smoker  . Smokeless tobacco: Never Used  Vaping Use  . Vaping Use: Never used  Substance and Sexual Activity  . Alcohol use: Never  . Drug use: Never  . Sexual activity: Yes    Birth control/protection: None  Other Topics Concern  . Not on file  Social History Narrative  . Not on file   Social Determinants of Health   Financial Resource Strain:   . Difficulty of Paying Living Expenses:   Food Insecurity:   . Worried About Programme researcher, broadcasting/film/video in the Last Year:   . Barista in the Last Year:   Transportation Needs:   . Freight forwarder (Medical):   Marland Kitchen Lack of Transportation (Non-Medical):   Physical Activity:   . Days of Exercise per Week:     . Minutes of Exercise per Session:   Stress:   . Feeling of Stress :   Social Connections:   . Frequency of Communication with Friends and Family:   . Frequency of Social Gatherings with Friends and Family:   . Attends Religious Services:   . Active Member of Clubs or Organizations:   . Attends Banker Meetings:   Marland Kitchen Marital Status:   Intimate Partner Violence:   . Fear of Current or Ex-Partner:   . Emotionally Abused:   Marland Kitchen Physically Abused:   . Sexually Abused:     No Known Allergies  Prior to Admission medications   Medication Sig Start Date End Date Taking? Authorizing Provider  misoprostol (CYTOTEC) 200 MCG tablet Insert four tablets vaginally, repeat every 6-hrs as needed 05/09/20   Conard Novak, MD  ondansetron (ZOFRAN ODT) 4 MG disintegrating tablet Take 1 tablet (4 mg total) by mouth every 6 (six) hours as needed for nausea. 05/09/20   Conard Novak, MD    Physical Exam BP 118/74   Wt 115 lb (52.2 kg)   BMI 27.73 kg/m    General: NAD HEENT: normocephalic, anicteric Pulmonary: No increased work of breathing Extremities: no edema, erythema, or tenderness Neurologic: Grossly intact, normal gait  Psychiatric: mood appropriate, affect full  Urine Pregnancy Test: negative  Imaging Results US PELVIS TRANSVAGINAL NON-OB (TV ONLY)  Result Date: 06/13/2020 Patient Name: Nelva Hauk DOB: 1980-12-31 MRN: [id number] ULTRASOUND REPORT Location: [facility] Date of Service: 06/13/2020 Indications: Rule out products of conception Findings: The uterus is anteverted and measures 6.4 x 4.5 x 3.7 cm. Echo texture is homogenous without evidence of focal masses. The Endometrium measures 7.2 mm. There is echogenic material within the endometrial canal measuring 23.9 x 4.3 x 17.5 mm. No blood flow is seen within. There is normal blood flow seen within the uterus. Right Ovary measures 2.6 x 2.5 x 1.8 cm. It is normal in appearance. Left Ovary measures 2.8 x  1.6 x 1.8 cm. It is normal in appearance. Survey of the adnexa demonstrates no adnexal masses. There is no free fluid in the cul de sac. Impression: 1. There is echogenic material within the endometrial canal, no blood flow. This could represent a hematoma versus retained products of conception. 2. Normal blood flow within the uterus. 3. Normal appearing cervix and ovaries. Deanna Artis, RT The ultrasound images and findings were reviewed by me and I agree with the above report. Thomasene Mohair, MD, [facility], [facility] 06/13/2020 11:57 AM       Assessment: 39 y.o. G3P1011  1. Missed abortion      Plan: Problem List Items Addressed This Visit    None    Visit Diagnoses    Missed abortion    -  Primary     It appears she has completed her miscarriage.  We discussed the findings on ultrasound and while they could represent retained products of conception, this is less likely. She will monitor symptoms for irregular bleeding.  If she has irregular bleeding, heavy bleeding, pain, fever, she is to let me know asap. Discussed that infection of this material can be very serious.  She voiced understanding. She would like to avoid surgery at this time. I think this is reasonable given the amount of material in her uterus was small and the sonographic characteristics seemed unlikely to be retained POC.  She had a negative pregnancy test today and has received rhogam.   Will start her on a combined OCP.  She has no contraindications to combined OCPs. Discussed possible risks of VTE.  Spanish interpreter present throughout visit.  A total of 20 minutes were spent face-to-face with the patient as well as preparation, review, communication, and documentation during this encounter.    Return in about 3 months (around 09/13/2020) for Annual Gynecologic Examination.   Thomasene Mohair, MD, [facility], [facility] 06/13/2020 12:19 PM

## 2020-11-30 ENCOUNTER — Other Ambulatory Visit: Payer: Self-pay

## 2020-11-30 ENCOUNTER — Ambulatory Visit (LOCAL_COMMUNITY_HEALTH_CENTER): Payer: BC Managed Care – PPO

## 2020-11-30 VITALS — BP 91/57 | Ht <= 58 in | Wt 116.5 lb

## 2020-11-30 DIAGNOSIS — Z3201 Encounter for pregnancy test, result positive: Secondary | ICD-10-CM

## 2020-11-30 LAB — PREGNANCY, URINE: Preg Test, Ur: POSITIVE — AB

## 2020-11-30 MED ORDER — PRENATAL 27-0.8 MG PO TABS: 1.0000 | ORAL_TABLET | Freq: Every day | ORAL | 0 refills | Status: DC

## 2020-11-30 NOTE — Progress Notes (Addendum)
UPT positive. Plans prenatal care at [facility]. Reports hx of diet controlled gestational diabetes with preg 2020. No hx insulin and denies diabetes after delivery. Consult with Hazle Coca, CNM who advises pt to establish prenatal care and that she may begin prenatal care with [facility] if diabetes was controlled without insulin and if no diabetes after delivery. RN discussed provider recommendations. Pt in agreement. Sent to clerk for pre admit. Jerel Shepherd, RN  Agree with above note.  Hazle Coca, CNM 12/09/20

## 2020-12-13 ENCOUNTER — Telehealth: Payer: Self-pay | Admitting: Family Medicine

## 2020-12-13 NOTE — Telephone Encounter (Signed)
TC to patient who states she is waiting on covid test results and has a fever and body aches. She was here at the end of December and had a positive pregnancy test. She was given a packet of information including a medication list of safe medicines during pregnancy, and thinks her mother-in-law may have thrown the packet out. She states she remembers that it said she can take Tylenol for headaches and body aches, but can't remember how much. Patient states her bottle of Tylenol is 500mg  tablets and was counseled that she can take 2 tablets every 6 hours. Patient counseled not to take it more often that every 6 hours. Patient states understanding. Patient counseled to drink plenty of fluids. Patient states understanding. Interpreter M. .Yemen, RN

## 2020-12-13 NOTE — Telephone Encounter (Signed)
Pt. Spouse called. Stated that pt can't find her paperwork and wants to know how much tylenol she can take for a fever.

## 2021-01-12 ENCOUNTER — Other Ambulatory Visit: Payer: Self-pay

## 2021-01-12 ENCOUNTER — Ambulatory Visit: Payer: BC Managed Care – PPO | Admitting: Physician Assistant

## 2021-01-12 ENCOUNTER — Other Ambulatory Visit: Payer: Self-pay | Admitting: Physician Assistant

## 2021-01-12 ENCOUNTER — Encounter: Payer: Self-pay | Admitting: Physician Assistant

## 2021-01-12 ENCOUNTER — Telehealth: Payer: Self-pay | Admitting: Family Medicine

## 2021-01-12 ENCOUNTER — Telehealth: Payer: Self-pay

## 2021-01-12 VITALS — BP 87/36 | HR 65 | Temp 98.6°F | Wt 114.8 lb

## 2021-01-12 DIAGNOSIS — O09529 Supervision of elderly multigravida, unspecified trimester: Secondary | ICD-10-CM | POA: Diagnosis not present

## 2021-01-12 DIAGNOSIS — O09523 Supervision of elderly multigravida, third trimester: Secondary | ICD-10-CM | POA: Diagnosis not present

## 2021-01-12 DIAGNOSIS — Z8632 Personal history of gestational diabetes: Secondary | ICD-10-CM | POA: Insufficient documentation

## 2021-01-12 DIAGNOSIS — A6000 Herpesviral infection of urogenital system, unspecified: Secondary | ICD-10-CM

## 2021-01-12 DIAGNOSIS — Z98891 History of uterine scar from previous surgery: Secondary | ICD-10-CM | POA: Insufficient documentation

## 2021-01-12 LAB — URINALYSIS
Bilirubin, UA: NEGATIVE
Glucose, UA: NEGATIVE
Ketones, UA: NEGATIVE
Leukocytes,UA: NEGATIVE
Nitrite, UA: NEGATIVE
Protein,UA: NEGATIVE
RBC, UA: NEGATIVE
Specific Gravity, UA: 1.025 (ref 1.005–1.030)
Urobilinogen, Ur: 0.2 mg/dL (ref 0.2–1.0)
pH, UA: 6.5 (ref 5.0–7.5)

## 2021-01-12 LAB — HEMOGLOBIN, FINGERSTICK: Hemoglobin: 11.3 g/dL (ref 11.1–15.9)

## 2021-01-12 NOTE — Progress Notes (Signed)
Urine dip reviewed with provider, no new orders. Patient desires covid vaccine #1 today, and was walked down to vaccine area. Patient states she will go to [facility] after her vaccine. [facility] contact card given today. Patient was unable to make her next appointment today due to March appointment schedule not yet available (name added to list of patients to call).Burt Knack, RN

## 2021-01-12 NOTE — Telephone Encounter (Signed)
TC to patient to schedule 4 week RV around 02/09/2021. Unable to LM, VM is full.Burt Knack, RN

## 2021-01-12 NOTE — Progress Notes (Signed)
[facility] HEALTH DEPT [facility] [address] [place] [address] [phone]  INITIAL PRENATAL VISIT NOTE  Subjective:  Ariana Hall is a 40 y.o. [id number] at [redacted]w[redacted]d being seen today to start prenatal care at the [facility] Department.  She is currently monitored for the following issues for this high-risk pregnancy and has Herpes genitalis; Cesarean delivery delivered; Encounter for supervision of high-risk pregnancy with multigravida of advanced maternal age; History of cesarean delivery; History of gestational diabetes; and Antepartum multigravida of advanced maternal age on their problem list.  Patient reports no complaints.  Contractions: Not present. Vag. Bleeding: None.  Movement: Absent. Denies leaking of fluid.   Indications for ASA therapy (per uptodate) One of the following: Previous pregnancy with preeclampsia, especially early onset and with an adverse outcome No Multifetal gestation No Chronic hypertension No Type 1 or 2 diabetes mellitus No Chronic kidney disease No Autoimmune disease (antiphospholipid syndrome, systemic lupus erythematosus) No  Two or more of the following: Nulliparity No Obesity (body mass index >30 kg/m2) No Family history of preeclampsia in mother or sister No Age ?35 years Yes Sociodemographic characteristics (African American race, low socioeconomic level) No Personal risk factors (eg, previous pregnancy with low birth weight or small for gestational age infant, previous adverse pregnancy outcome [eg, stillbirth], interval >10 years between pregnancies) No   The following portions of the patient's history were reviewed and updated as appropriate: allergies, current medications, past family history, past medical history, past social history, past surgical history and problem list. Problem list updated.  Objective:   Vitals:   01/12/21 0836  BP: (!) 87/36  Pulse: 65   Temp: 98.6 F (37 C)  Weight: 114 lb 12.8 oz (52.1 kg)    Fetal Status: Fetal Heart Rate (bpm): 164 Fundal Height: 12 cm Movement: Absent  Presentation: Undeterminable   Physical Exam Vitals and nursing note reviewed.  Constitutional:      General: She is not in acute distress.    Appearance: Normal appearance. She is well-developed.  HENT:     Head: Normocephalic and atraumatic.     Right Ear: External ear normal.     Left Ear: External ear normal.     Nose: Nose normal. No congestion or rhinorrhea.     Mouth/Throat:     Lips: Pink.     Mouth: Mucous membranes are moist.     Dentition: Normal dentition. No dental caries.     Pharynx: Oropharynx is clear. Uvula midline.  Eyes:     General: No scleral icterus.    Conjunctiva/sclera: Conjunctivae normal.  Neck:     Thyroid: No thyroid mass or thyromegaly.  Cardiovascular:     Rate and Rhythm: Normal rate and regular rhythm.     Pulses: Normal pulses.     Comments: Extremities are warm and well perfused Pulmonary:     Effort: Pulmonary effort is normal.     Breath sounds: Normal breath sounds.  Chest:     Chest wall: No mass.  Breasts:     Tanner Score is 5. Breasts are symmetrical.     Right: Tenderness present. No inverted nipple, mass, nipple discharge, skin change or axillary adenopathy.     Left: Tenderness present. No inverted nipple, mass, nipple discharge, skin change or axillary adenopathy.    Abdominal:     General: Abdomen is flat.     Palpations: Abdomen is soft.     Tenderness: There is no abdominal  tenderness.     Comments: Gravid   Genitourinary:    General: Normal vulva.     Exam position: Lithotomy position.     Pubic Area: No rash.      Labia:        Right: No rash.        Left: No rash.      Vagina: Normal. No vaginal discharge.     Cervix: No cervical motion tenderness or friability.     Uterus: Normal. Enlarged (Gravid 12 week size). Not tender.      Adnexa: Right adnexa normal and left  adnexa normal.     Rectum: Normal. No external hemorrhoid.  Musculoskeletal:     Right lower leg: No edema.     Left lower leg: No edema.  Lymphadenopathy:     Cervical: No cervical adenopathy.     Upper Body:     Right upper body: No axillary adenopathy.     Left upper body: No axillary adenopathy.  Skin:    General: Skin is warm.     Capillary Refill: Capillary refill takes less than 2 seconds.     Comments: Well-healed horizontal suprapubic scar  Neurological:     Mental Status: She is alert.     Assessment and Plan:  Pregnancy: G4P1021 at [redacted]w[redacted]d  1. Encounter for supervision of high-risk pregnancy with multigravida of advanced maternal age Normal PE with sure LMP. Provided overview of prenatal care services. Pt declines genetic counseling or aneuploidy testing. Desires COVID vaccination. - Chlamydia/GC NAA, Confirmation - HIV-1/HIV-2 Qualitative RNA - Prenatal profile without Varicella or Rubella - Urine Culture - HCV Ab w Reflex to Quant PCR - Glucose tolerance, 1 hour - QuantiFERON-TB Gold Plus - Lead, blood (adult age 29 yrs or greater) - Hemoglobin, fingerstick - Urinalysis  2. History of cesarean delivery H/o primary LTCS due to FTP after term IOL due to GDM. Briefly discussed option for TOLAC. C/s op note avail in EMR.  3. History of gestational diabetes Had diet-controlled GDM prior pregnancy, screen for DM today. - Glucose tolerance, 1 hour  4. Genital herpes simplex, unspecified site Pt without sx, plan daily suppression 36 weeks til delivery.  Discussed overview of care and coordination with inpatient delivery practices including [facility], [facility], [facility] and [facility].   Preterm labor symptoms and general obstetric precautions including but not limited to vaginal bleeding, contractions, leaking of fluid and fetal movement were reviewed in detail with the patient.  Please refer to After Visit Summary for other counseling recommendations.    Return in about 4 weeks (around 02/09/2021) for Routine prenatal care.  Future Appointments  Date Time Provider Department Center  02/09/2021  3:30 PM [facility] PROVIDER [facility] None    Landry Dyke, PA-C

## 2021-01-12 NOTE — Telephone Encounter (Signed)
Client returned call and [facility] RV appt scheduled. Jossie Ng, RN

## 2021-01-12 NOTE — Progress Notes (Signed)
Patient here for new OB at about 11 5/7. Patient had GDM in last pregnancy. Patient states she had flu vaccine at work right before Christmas. She and FOB and their son live together. Needs 1 hour gtt today. Burt Knack, RN

## 2021-01-12 NOTE — Telephone Encounter (Signed)
Refer to previously opened phone encounter on 01/12/2021. Jossie Ng, RN

## 2021-01-12 NOTE — Telephone Encounter (Signed)
Pt. missed a call , she thinks it's to make her follow up appointment.

## 2021-01-13 DIAGNOSIS — O26899 Other specified pregnancy related conditions, unspecified trimester: Secondary | ICD-10-CM | POA: Insufficient documentation

## 2021-01-13 DIAGNOSIS — Z6791 Unspecified blood type, Rh negative: Secondary | ICD-10-CM | POA: Insufficient documentation

## 2021-01-13 LAB — CBC/D/PLT+RPR+RH+ABO+AB SCR
Antibody Screen: NEGATIVE
Basophils Absolute: 0 10*3/uL (ref 0.0–0.2)
Basos: 0 %
EOS (ABSOLUTE): 0.1 10*3/uL (ref 0.0–0.4)
Eos: 1 %
Hematocrit: 34.1 % (ref 34.0–46.6)
Hemoglobin: 11.2 g/dL (ref 11.1–15.9)
Hepatitis B Surface Ag: NEGATIVE
Immature Grans (Abs): 0 10*3/uL (ref 0.0–0.1)
Immature Granulocytes: 1 %
Lymphocytes Absolute: 1.5 10*3/uL (ref 0.7–3.1)
Lymphs: 19 %
MCH: 30 pg (ref 26.6–33.0)
MCHC: 32.8 g/dL (ref 31.5–35.7)
MCV: 91 fL (ref 79–97)
Monocytes Absolute: 0.6 10*3/uL (ref 0.1–0.9)
Monocytes: 7 %
Neutrophils Absolute: 5.9 10*3/uL (ref 1.4–7.0)
Neutrophils: 72 %
Platelets: 246 10*3/uL (ref 150–450)
RBC: 3.73 x10E6/uL — ABNORMAL LOW (ref 3.77–5.28)
RDW: 12.8 % (ref 11.7–15.4)
RPR Ser Ql: NONREACTIVE
Rh Factor: NEGATIVE
WBC: 8.2 10*3/uL (ref 3.4–10.8)

## 2021-01-13 LAB — HCV AB W REFLEX TO QUANT PCR: HCV Ab: <0.1 s/co ratio (ref 0.0–0.9)

## 2021-01-13 LAB — HCV INTERPRETATION

## 2021-01-14 LAB — QUANTIFERON-TB GOLD PLUS
QuantiFERON Mitogen Value: >10 IU/mL
QuantiFERON Nil Value: 0.04 IU/mL
QuantiFERON TB1 Ag Value: 0.31 IU/mL
QuantiFERON TB2 Ag Value: 0.25 IU/mL
QuantiFERON-TB Gold Plus: NEGATIVE

## 2021-01-14 LAB — CHLAMYDIA/GC NAA, CONFIRMATION
Chlamydia trachomatis, NAA: NEGATIVE
Neisseria gonorrhoeae, NAA: NEGATIVE

## 2021-01-14 LAB — HIV-1/HIV-2 QUALITATIVE RNA
HIV-1 RNA, Qualitative: NONREACTIVE
HIV-2 RNA, Qualitative: NONREACTIVE

## 2021-01-14 LAB — GLUCOSE, 1 HOUR GESTATIONAL: Gestational Diabetes Screen: 95 mg/dL (ref 65–139)

## 2021-01-14 LAB — LEAD, BLOOD (ADULT >= 16 YRS): Lead-Whole Blood: <1 ug/dL (ref 0–4)

## 2021-01-15 LAB — URINE CULTURE

## 2021-01-16 ENCOUNTER — Other Ambulatory Visit: Payer: Self-pay | Admitting: Advanced Practice Midwife

## 2021-01-16 ENCOUNTER — Encounter: Payer: Self-pay | Admitting: Advanced Practice Midwife

## 2021-01-16 ENCOUNTER — Telehealth: Payer: Self-pay

## 2021-01-16 DIAGNOSIS — O2340 Unspecified infection of urinary tract in pregnancy, unspecified trimester: Secondary | ICD-10-CM

## 2021-01-16 MED ORDER — NITROFURANTOIN MONOHYD MACRO 100 MG PO CAPS: 100.0000 mg | ORAL_CAPSULE | Freq: Two times a day (BID) | ORAL | 0 refills | Status: AC

## 2021-01-16 NOTE — Telephone Encounter (Signed)
Call to client to notify her of UTI and need for treatment. Per client, can pick up antibiotic in a few hours today. Jossie Ng, RN

## 2021-02-09 ENCOUNTER — Ambulatory Visit: Payer: BC Managed Care – PPO | Admitting: Physician Assistant

## 2021-02-09 ENCOUNTER — Other Ambulatory Visit: Payer: Self-pay

## 2021-02-09 VITALS — BP 82/37 | HR 62 | Temp 97.3°F | Wt 117.8 lb

## 2021-02-09 DIAGNOSIS — O2340 Unspecified infection of urinary tract in pregnancy, unspecified trimester: Secondary | ICD-10-CM

## 2021-02-09 DIAGNOSIS — O09529 Supervision of elderly multigravida, unspecified trimester: Secondary | ICD-10-CM

## 2021-02-09 NOTE — Progress Notes (Signed)
   PRENATAL VISIT NOTE  Subjective:  Ariana Hall is a 40 y.o. [phone] at [redacted]w[redacted]d being seen today for ongoing prenatal care.  She is currently monitored for the following issues for this high-risk pregnancy and has Herpes genitalis; Cesarean delivery delivered; Encounter for supervision of high-risk pregnancy with multigravida of advanced maternal age; History of cesarean delivery; History of gestational diabetes; Antepartum multigravida of advanced maternal age; Rh negative state in antepartum period; and UTI in pregnancy, antepartum 01/12/21 >100,000 E. Coli on their problem list.  Patient reports no complaints.  Contractions: Not present. Vag. Bleeding: None.  Movement: Absent. Denies leaking of fluid/ROM.   The following portions of the patient's history were reviewed and updated as appropriate: allergies, current medications, past family history, past medical history, past social history, past surgical history and problem list. Problem list updated.  Objective:   Vitals:   02/09/21 1534  BP: (!) 82/37  Pulse: 62  Temp: (!) 97.3 F (36.3 C)  Weight: 117 lb 12.8 oz (53.4 kg)    Fetal Status: Fetal Heart Rate (bpm): 156 Fundal Height: 15 cm Movement: Absent     General:  Alert, oriented and cooperative. Patient is in no acute distress.  Skin: Skin is warm and dry. No rash noted.   Cardiovascular: Normal heart rate noted  Respiratory: Normal respiratory effort, no problems with respiration noted  Abdomen: Soft, gravid, appropriate for gestational age.  Pain/Pressure: Absent     Pelvic: Cervical exam deferred        Extremities: Normal range of motion.  Edema: None  Mental Status: Normal mood and affect. Normal behavior. Normal judgment and thought content.   Assessment and Plan:  Pregnancy: G4P1021 at [redacted]w[redacted]d  1. Encounter for supervision of high-risk pregnancy with multigravida of advanced maternal age Schedule routine fetal anatomy US in 3-4 weeks. Declines MSS. - Urine  Culture  2. UTI in pregnancy, antepartum 01/12/21 >100,000 E. Coli Completed antibiotic. U C&S for TOC today.   Preterm labor symptoms and general obstetric precautions including but not limited to vaginal bleeding, contractions, leaking of fluid and fetal movement were reviewed in detail with the patient. Please refer to After Visit Summary for other counseling recommendations.  Return in about 4 weeks (around 03/09/2021) for Routine prenatal care.  No future appointments.  Landry Dyke, PA-C

## 2021-02-09 NOTE — Progress Notes (Addendum)
Urine cx today as reports completing antibiotic for UTI. Counseled regarding quad screen and declination form signed. Jossie Ng, RN  [facility] anatomy US referral faxed with fax confirmation received. [facility] scheduler will notify client of appt via phone call. Jossie Ng, RN Per [facility], anatomy US scheduled for 03/16/2021 at 3 pm [facility]). Jossie Ng, RN

## 2021-02-12 LAB — URINE CULTURE

## 2021-03-09 ENCOUNTER — Other Ambulatory Visit: Payer: Self-pay

## 2021-03-09 ENCOUNTER — Ambulatory Visit: Payer: BC Managed Care – PPO

## 2021-03-09 ENCOUNTER — Other Ambulatory Visit: Payer: Self-pay | Admitting: Physician Assistant

## 2021-03-09 ENCOUNTER — Ambulatory Visit: Payer: BC Managed Care – PPO | Admitting: Physician Assistant

## 2021-03-09 VITALS — BP 93/47 | HR 80 | Temp 98.2°F | Wt 122.6 lb

## 2021-03-09 DIAGNOSIS — O09522 Supervision of elderly multigravida, second trimester: Secondary | ICD-10-CM

## 2021-03-09 DIAGNOSIS — Z98891 History of uterine scar from previous surgery: Secondary | ICD-10-CM

## 2021-03-09 DIAGNOSIS — O09529 Supervision of elderly multigravida, unspecified trimester: Secondary | ICD-10-CM

## 2021-03-09 MED ORDER — COMPLETENATE 29-1 MG PO CHEW
1.0000 | CHEWABLE_TABLET | Freq: Every day | ORAL | 3 refills | Status: DC
Start: 2021-03-09 — End: 2021-03-14

## 2021-03-09 NOTE — Progress Notes (Signed)
Patient here for MH RV at 19 5/7. Aware of [facility] U/S on 03/16/2021 at [facility], address given to patient. Patient states she has been using monistat due itching and white discharge. States she is feeling better. Patient needs more PNV, pharmacy verified.Burt Knack, RN

## 2021-03-09 NOTE — Progress Notes (Signed)
   PRENATAL VISIT NOTE  Subjective:  Ariana Hall is a 40 y.o. [phone] at [redacted]w[redacted]d being seen today for ongoing prenatal care.  She is currently monitored for the following issues for this high-risk pregnancy and has Herpes genitalis; Cesarean delivery delivered; Encounter for supervision of high-risk pregnancy with multigravida of advanced maternal age; History of cesarean delivery; History of gestational diabetes; Antepartum multigravida of advanced maternal age; Rh negative state in antepartum period; and UTI in pregnancy, antepartum 01/12/21 >100,000 E. Coli on their problem list.  Patient reports she developed a vag yeast infection after she took her UTI antibiotic, took a 3-day vag yeast cream and discharge has resolved.  Contractions: Not present. Vag. Bleeding: None.  Movement: Present. Denies leaking of fluid/ROM.   The following portions of the patient's history were reviewed and updated as appropriate: allergies, current medications, past family history, past medical history, past social history, past surgical history and problem list. Problem list updated.  Objective:   Vitals:   03/09/21 1555  BP: (!) 93/47  Pulse: 80  Temp: 98.2 F (36.8 C)  Weight: 122 lb 9.6 oz (55.6 kg)    Fetal Status: Fetal Heart Rate (bpm): 130 Fundal Height: 20 cm Movement: Present     General:  Alert, oriented and cooperative. Patient is in no acute distress.  Skin: Skin is warm and dry. No rash noted.   Cardiovascular: Normal heart rate noted  Respiratory: Normal respiratory effort, no problems with respiration noted  Abdomen: Soft, gravid, appropriate for gestational age.  Pain/Pressure: Absent     Pelvic: Cervical exam deferred        Extremities: Normal range of motion.  Edema: None  Mental Status: Normal mood and affect. Normal behavior. Normal judgment and thought content.   Assessment and Plan:  Pregnancy: G4P1021 at [redacted]w[redacted]d  1. Encounter for supervision of high-risk pregnancy with  multigravida of advanced maternal age Declines quad screen. Enc to keep anat [place] as sched. eRx prenatal vitamins.  2. History of cesarean delivery Discussion of TOLAC vs repeat C/S. Per VBAC calculator, chance of success estimated at 37%. Pt leaning toward repeat C/S (also wants concurrent BTL).   Preterm labor symptoms and general obstetric precautions including but not limited to vaginal bleeding, contractions, leaking of fluid and fetal movement were reviewed in detail with the patient. Please refer to After Visit Summary for other counseling recommendations.  Return in about 4 weeks (around 04/06/2021) for Routine prenatal care.  Future Appointments  Date Time Provider Department Center  04/06/2021  4:00 PM [facility]-MH PROVIDER AC-MAT None    Landry Dyke, PA-C

## 2021-04-06 ENCOUNTER — Other Ambulatory Visit: Payer: Self-pay

## 2021-04-06 ENCOUNTER — Ambulatory Visit: Payer: BC Managed Care – PPO | Admitting: Advanced Practice Midwife

## 2021-04-06 DIAGNOSIS — Z8632 Personal history of gestational diabetes: Secondary | ICD-10-CM

## 2021-04-06 DIAGNOSIS — O09522 Supervision of elderly multigravida, second trimester: Secondary | ICD-10-CM

## 2021-04-06 DIAGNOSIS — O09529 Supervision of elderly multigravida, unspecified trimester: Secondary | ICD-10-CM

## 2021-04-06 NOTE — Progress Notes (Signed)
   PRENATAL VISIT NOTE  Subjective:  Ariana Hall is a 40 y.o. [phone] at [redacted]w[redacted]d being seen today for ongoing prenatal care.  She is currently monitored for the following issues for this high-risk pregnancy and has Herpes genitalis; Cesarean delivery delivered; Encounter for supervision of high-risk pregnancy with multigravida of advanced maternal age; History of cesarean delivery; History of gestational diabetes; Antepartum multigravida of advanced maternal age; Rh negative state in antepartum period; and UTI in pregnancy, antepartum 01/12/21 >100,000 E. Coli on their problem list.  Patient reports no complaints.  Contractions: Not present. Vag. Bleeding: None.  Movement: Present. Denies leaking of fluid/ROM.   The following portions of the patient's history were reviewed and updated as appropriate: allergies, current medications, past family history, past medical history, past social history, past surgical history and problem list. Problem list updated.  Objective:   Vitals:   04/06/21 1547  BP: (!) 89/43  Pulse: 70  Temp: 97.7 F (36.5 C)  Weight: 124 lb 6.4 oz (56.4 kg)    Fetal Status:     Movement: Present     General:  Alert, oriented and cooperative. Patient is in no acute distress.  Skin: Skin is warm and dry. No rash noted.   Cardiovascular: Normal heart rate noted  Respiratory: Normal respiratory effort, no problems with respiration noted  Abdomen: Soft, gravid, appropriate for gestational age.  Pain/Pressure: Absent     Pelvic: Cervical exam deferred        Extremities: Normal range of motion.  Edema: None  Mental Status: Normal mood and affect. Normal behavior. Normal judgment and thought content.   Assessment and Plan:  Pregnancy: G4P1021 at [redacted]w[redacted]d  1. History of gestational diabetes Needs glucola at next apt  2. Encounter for supervision of high-risk pregnancy with multigravida of advanced maternal age Declined Quad Leaning towards repeat c/s and Nexplanon  pp Reviewed 03/16/21 u/s at 21 2/7 wks with anterior placenta, 3VC, anatomy wnl Working 40 hrs/wk   Preterm labor symptoms and general obstetric precautions including but not limited to vaginal bleeding, contractions, leaking of fluid and fetal movement were reviewed in detail with the patient. Please refer to After Visit Summary for other counseling recommendations.  Return in about 4 weeks (around 05/04/2021) for 28 week labs.  No future appointments.  Alberteen Spindle, CNM

## 2021-04-06 NOTE — Progress Notes (Signed)
Patient here for MH RV at 23 5/7. S/S PTL reviewed and literature given. CMHRP and PHQ9 today.Burt Knack, RN

## 2021-04-19 ENCOUNTER — Telehealth: Payer: Self-pay

## 2021-04-19 NOTE — Telephone Encounter (Signed)
TC to schedule patient for next maternity appointment. Apparently no schedule available at her last visit. Patient is scheduled for 05/08/2021 and is aware that she will need her 28 week labs at that time. Patient asked about whether she would be getting "the shot" for her Rh negative blood and counseled that will be done at her visit on 05/08/2021. Patient also states she is almost out of PNV and counseled that she should have refills available at her pharmacy ([facility] in [place]). Patient counseled to call pharmacy to be sure and to let us know if she has any trouble. Patient states understanding.Burt Knack, RN

## 2021-04-26 NOTE — Addendum Note (Signed)
Addended by: Heywood Bene on: 04/26/2021 02:43 PM   Modules accepted: Orders

## 2021-05-04 ENCOUNTER — Ambulatory Visit: Payer: Self-pay

## 2021-05-08 ENCOUNTER — Ambulatory Visit: Payer: BC Managed Care – PPO | Admitting: Advanced Practice Midwife

## 2021-05-08 ENCOUNTER — Other Ambulatory Visit: Payer: Self-pay

## 2021-05-08 VITALS — BP 88/53 | HR 77 | Temp 97.2°F | Wt 129.0 lb

## 2021-05-08 DIAGNOSIS — Z8632 Personal history of gestational diabetes: Secondary | ICD-10-CM

## 2021-05-08 DIAGNOSIS — O09523 Supervision of elderly multigravida, third trimester: Secondary | ICD-10-CM

## 2021-05-08 DIAGNOSIS — Z23 Encounter for immunization: Secondary | ICD-10-CM | POA: Diagnosis not present

## 2021-05-08 DIAGNOSIS — O09529 Supervision of elderly multigravida, unspecified trimester: Secondary | ICD-10-CM

## 2021-05-08 DIAGNOSIS — Z6791 Unspecified blood type, Rh negative: Secondary | ICD-10-CM | POA: Diagnosis not present

## 2021-05-08 DIAGNOSIS — Z98891 History of uterine scar from previous surgery: Secondary | ICD-10-CM

## 2021-05-08 DIAGNOSIS — O26899 Other specified pregnancy related conditions, unspecified trimester: Secondary | ICD-10-CM

## 2021-05-08 DIAGNOSIS — O26893 Other specified pregnancy related conditions, third trimester: Secondary | ICD-10-CM

## 2021-05-08 LAB — HEMOGLOBIN, FINGERSTICK: Hemoglobin: 11.6 g/dL (ref 11.1–15.9)

## 2021-05-08 MED ORDER — RHO D IMMUNE GLOBULIN 1500 UNIT/2ML IJ SOSY
300.0000 ug | PREFILLED_SYRINGE | Freq: Once | INTRAMUSCULAR | Status: AC
  Administered 2021-05-08: 300 ug via INTRAMUSCULAR

## 2021-05-08 NOTE — Addendum Note (Signed)
Addended by: Jossie Ng on: 05/08/2021 10:23 AM   Modules accepted: Orders

## 2021-05-08 NOTE — Progress Notes (Signed)
   PRENATAL VISIT NOTE  Subjective:  Ariana Hall is a 40 y.o. [phone] at [redacted]w[redacted]d being seen today for ongoing prenatal care.  She is currently monitored for the following issues for this high-risk pregnancy and has Herpes genitalis; Cesarean delivery delivered; Encounter for supervision of high-risk pregnancy with multigravida of advanced maternal age; History of cesarean delivery; History of gestational diabetes; Antepartum multigravida of advanced maternal age: 40 yo; Rh negative state in antepartum period; and UTI in pregnancy, antepartum 01/12/21 >100,000 E. Coli on their problem list.  Patient reports bilateral sciatica pain while sleeping.  Contractions: Not present. Vag. Bleeding: None.  Movement: Present. Denies leaking of fluid/ROM.   The following portions of the patient's history were reviewed and updated as appropriate: allergies, current medications, past family history, past medical history, past social history, past surgical history and problem list. Problem list updated.  Objective:   Vitals:   05/08/21 0906  BP: (!) 88/53  Pulse: 77  Temp: (!) 97.2 F (36.2 C)  Weight: 129 lb (58.5 kg)    Fetal Status: Fetal Heart Rate (bpm): 160 Fundal Height: 31 cm Movement: Present     General:  Alert, oriented and cooperative. Patient is in no acute distress.  Skin: Skin is warm and dry. No rash noted.   Cardiovascular: Normal heart rate noted  Respiratory: Normal respiratory effort, no problems with respiration noted  Abdomen: Soft, gravid, appropriate for gestational age.  Pain/Pressure: Absent     Pelvic: Cervical exam deferred        Extremities: Normal range of motion.  Edema: None  Mental Status: Normal mood and affect. Normal behavior. Normal judgment and thought content.   Assessment and Plan:  Pregnancy: G4P1021 at [redacted]w[redacted]d  1. Encounter for supervision of high-risk pregnancy with multigravida of advanced maternal age S>D; u/s ordered Not exercising; 12 lb (5.443  kg); encouraged exercise 3-4x/wk x 20-30 min Wants repeat c/s with possible BTL Working 40 hrs/wk C/o bilateral sciatica discomfort just like with last pregnancy--suggestions given 1 hour glucola today  - Hemoglobin, venipuncture - RPR - Glucose, 1 hour gestational - HIV-1/HIV-2 Qualitative RNA - Antibody screen  2. Cesarean delivery delivered   3. History of gestational diabetes 28 wk labs with one hour glucola today  4. Antepartum multigravida of advanced maternal age: 40 yo Was not offered ASA 81 mg daily so not taking Declined Quad screen on 02/09/21  5. History of cesarean delivery Desires repeat c/s   Preterm labor symptoms and general obstetric precautions including but not limited to vaginal bleeding, contractions, leaking of fluid and fetal movement were reviewed in detail with the patient. Please refer to After Visit Summary for other counseling recommendations.  Return in about 2 weeks (around 05/22/2021) for routine PNC.  No future appointments.  Alberteen Spindle, CNM

## 2021-05-08 NOTE — Progress Notes (Addendum)
Remains undecided as to [facility]. Verified has [facility] contact card. Jossie Ng, RN Counseled regarding recommendation for Tdap in pregnancy and for those who will spend any time around infant. Client tolerated Tdap without complaint today. Jossie Ng, RN  [facility] [place] referral for size > dates faxed with snapsheet pages with confirmation received. Client aware [facility] will attempt to contact her by phone x3. Jossie Ng, RN  Hgb = 11.6 and no intervention needed per standing order. Tolerated Rhophylac without complaint. Jossie Ng, RN Per [facility], [place] scheduled for 05/18/21 at 1600 ([place]). Jossie Ng, RN

## 2021-05-09 ENCOUNTER — Telehealth: Payer: Self-pay

## 2021-05-09 DIAGNOSIS — O99810 Abnormal glucose complicating pregnancy: Secondary | ICD-10-CM | POA: Insufficient documentation

## 2021-05-09 LAB — GLUCOSE, 1 HOUR GESTATIONAL: Gestational Diabetes Screen: 136 mg/dL (ref 65–139)

## 2021-05-09 LAB — ANTIBODY SCREEN: Antibody Screen: NEGATIVE

## 2021-05-09 LAB — RPR: RPR Ser Ql: NONREACTIVE

## 2021-05-09 NOTE — Telephone Encounter (Signed)
Needs 3 hour GTT as one hour glucola = 136. Call to client and appt scheduled for 05/11/2021 with arrival time of 0800. Test prep instructions verbally provided to client with stated understanding. Jossie Ng, RN

## 2021-05-10 LAB — HIV-1/HIV-2 QUALITATIVE RNA
HIV-1 RNA, Qualitative: NONREACTIVE
HIV-2 RNA, Qualitative: NONREACTIVE

## 2021-05-11 ENCOUNTER — Other Ambulatory Visit: Payer: BC Managed Care – PPO

## 2021-05-11 ENCOUNTER — Other Ambulatory Visit: Payer: Self-pay

## 2021-05-11 DIAGNOSIS — O99810 Abnormal glucose complicating pregnancy: Secondary | ICD-10-CM

## 2021-05-11 NOTE — Progress Notes (Signed)
Pt states the last time she had anything to eat or drink, other than small sips of water, was before 8:00 pm last night.  Pt has times to return to lab for 3 hour gtt; instructions for 3 hr given.

## 2021-05-11 NOTE — Progress Notes (Signed)
Fax received from [employer] stating [employer] company is denying payment for QFT testing done on 01/12/2021 and medical justification is needed. Form completed by A. Streilein PA-C and faxed back to [employer] with confirmation received. Forms and confirmation labeled and sent for scanning (Per R. Gutierrez in [employer], client did not have presumptive Medicaid 01/12/2021 as over income). Jossie Ng, RN

## 2021-05-12 LAB — GLUCOSE TOLERANCE TEST, 6 HOUR
Glucose, 1 Hour GTT: 140 mg/dL (ref 65–199)
Glucose, 2 hour: 169 mg/dL — ABNORMAL HIGH (ref 65–139)
Glucose, 3 hour: 136 mg/dL — ABNORMAL HIGH (ref 65–109)
Glucose, GTT - Fasting: 79 mg/dL (ref 65–99)

## 2021-05-15 ENCOUNTER — Telehealth: Payer: Self-pay | Admitting: Family Medicine

## 2021-05-15 DIAGNOSIS — R7309 Other abnormal glucose: Secondary | ICD-10-CM

## 2021-05-15 NOTE — Telephone Encounter (Signed)
Call to patient to discuss abnormal 2 hr gtt lab result.  Informed patient of need for dietary referral and consult.  Patient verbalizes understanding.    Wendi Snipes, FNP

## 2021-05-19 ENCOUNTER — Telehealth: Payer: Self-pay | Admitting: Dietician

## 2021-05-22 ENCOUNTER — Other Ambulatory Visit: Payer: Self-pay

## 2021-05-22 ENCOUNTER — Ambulatory Visit: Payer: BC Managed Care – PPO | Admitting: Family Medicine

## 2021-05-22 VITALS — BP 88/55 | HR 71 | Temp 97.3°F | Wt 128.6 lb

## 2021-05-22 DIAGNOSIS — O99810 Abnormal glucose complicating pregnancy: Secondary | ICD-10-CM

## 2021-05-22 DIAGNOSIS — O09529 Supervision of elderly multigravida, unspecified trimester: Secondary | ICD-10-CM

## 2021-05-22 DIAGNOSIS — O26893 Other specified pregnancy related conditions, third trimester: Secondary | ICD-10-CM

## 2021-05-22 DIAGNOSIS — Z98891 History of uterine scar from previous surgery: Secondary | ICD-10-CM

## 2021-05-22 DIAGNOSIS — Z6791 Unspecified blood type, Rh negative: Secondary | ICD-10-CM

## 2021-05-22 DIAGNOSIS — Z8632 Personal history of gestational diabetes: Secondary | ICD-10-CM

## 2021-05-22 DIAGNOSIS — O09523 Supervision of elderly multigravida, third trimester: Secondary | ICD-10-CM

## 2021-05-22 DIAGNOSIS — A6000 Herpesviral infection of urogenital system, unspecified: Secondary | ICD-10-CM

## 2021-05-22 NOTE — Progress Notes (Addendum)
Per client, received call from [facility] nutritionist on 05/19/2021. Client kept 05/18/2021 [facility] [place]. RN verified client has [facility] contact card. Jossie Ng, RN Referral for delivery plans appt faxed with fax confirmation received. Jossie Ng, RN

## 2021-05-22 NOTE — Progress Notes (Signed)
   PRENATAL VISIT NOTE  Subjective:  Ariana Hall is a 40 y.o. [phone] at [redacted]w[redacted]d being seen today for ongoing prenatal care.  She is currently monitored for the following issues for this high-risk pregnancy and has Herpes genitalis; Encounter for supervision of high-risk pregnancy with multigravida of advanced maternal age; History of cesarean delivery; History of gestational diabetes; Antepartum multigravida of advanced maternal age: 40 yo; Rh negative state in antepartum period; UTI in pregnancy, antepartum 01/12/21 >100,000 E. Coli; and Abnormal glucose tolerance in pregnancy on their problem list.  Patient reports no complaints.  Contractions: Not present. Vag. Bleeding: None.  Movement: Present. Denies leaking of fluid/ROM.   The following portions of the patient's history were reviewed and updated as appropriate: allergies, current medications, past family history, past medical history, past social history, past surgical history and problem list. Problem list updated.  Objective:   Vitals:   05/22/21 1513  BP: (!) 88/55  Pulse: 71  Temp: (!) 97.3 F (36.3 C)  Weight: 128 lb 9.6 oz (58.3 kg)    Fetal Status: Fetal Heart Rate (bpm): 155 Fundal Height: 31 cm Movement: Present     General:  Alert, oriented and cooperative. Patient is in no acute distress.  Skin: Skin is warm and dry. No rash noted.   Cardiovascular: Normal heart rate noted  Respiratory: Normal respiratory effort, no problems with respiration noted  Abdomen: Soft, gravid, appropriate for gestational age.  Pain/Pressure: Absent     Pelvic: Cervical exam deferred        Extremities: Normal range of motion.  Edema: None  Mental Status: Normal mood and affect. Normal behavior. Normal judgment and thought content.   Assessment and Plan:  Pregnancy: G4P1021 at [redacted]w[redacted]d  1. Antepartum multigravida of advanced maternal age: 40 yo   2. History of cesarean delivery Desires repeat Would consider TOLAC if spontaneous  labor  3. History of gestational diabetes Passed 3hr this pregnancy. 1h 136 (just above threshold)  4. Rh negative state in antepartum period Received Rhogam 05/08/21   5. Encounter for supervision of high-risk pregnancy with multigravida of advanced maternal age Up to date  No concerns today Reviewed TOLAC vs RCS again and patient confirmed desire for RCS. She would consider TOLAC if sponateous labor She desires BTS at this point and counseled about ability to rescind at any point   Reviewed [place] from 6/16- Growth WNL 53rd%, AFI normal at 19.2  6. Abnormal glucose tolerance in pregnancy Has 1 value of three that was elevated referred to [facility], received call and will schedule Reviewed focusing on protein in diet. .   7. Herpes simplex infection of genitourinary system Needs PPX at 36 wks.    Preterm labor symptoms and general obstetric precautions including but not limited to vaginal bleeding, contractions, leaking of fluid and fetal movement were reviewed in detail with the patient.  Please refer to After Visit Summary for other counseling recommendations.   Return in about 2 weeks (around 06/05/2021) for Routine prenatal care.  No future appointments.  Federico Flake, MD

## 2021-05-25 ENCOUNTER — Telehealth: Payer: Self-pay

## 2021-05-25 NOTE — Telephone Encounter (Signed)
TC to patient to inform of [facility] TOLAC video appointment on 06/12/2021 at 1:15 pm. Patient states she is aware of appointment.Burt Knack, RN

## 2021-06-06 ENCOUNTER — Other Ambulatory Visit: Payer: Self-pay

## 2021-06-06 ENCOUNTER — Ambulatory Visit: Payer: BC Managed Care – PPO | Admitting: Advanced Practice Midwife

## 2021-06-06 VITALS — BP 87/50 | HR 80 | Temp 37.7°F | Wt 130.6 lb

## 2021-06-06 DIAGNOSIS — O99810 Abnormal glucose complicating pregnancy: Secondary | ICD-10-CM

## 2021-06-06 DIAGNOSIS — Z98891 History of uterine scar from previous surgery: Secondary | ICD-10-CM

## 2021-06-06 DIAGNOSIS — O09529 Supervision of elderly multigravida, unspecified trimester: Secondary | ICD-10-CM

## 2021-06-06 NOTE — Progress Notes (Signed)
Patient here for MH RV at 32 3/7. Kick counts reviewed and cards given. Aware of [facility] video TOLAC appointment on 06/12/2021 at 1:15pm..Bartholomew Ramesh Ahtanum, RN

## 2021-06-06 NOTE — Progress Notes (Signed)
   PRENATAL VISIT NOTE  Subjective:  Ariana Hall is a 40 y.o. [phone] at [redacted]w[redacted]d being seen today for ongoing prenatal care.  She is currently monitored for the following issues for this high-risk pregnancy and has Herpes genitalis; Encounter for supervision of high-risk pregnancy with multigravida of advanced maternal age; History of cesarean delivery; History of gestational diabetes; Antepartum multigravida of advanced maternal age: 40 yo; Rh negative state in antepartum period; UTI in pregnancy, antepartum 01/12/21 >100,000 E. Coli; and Abnormal glucose tolerance in pregnancy on their problem list.  Patient reports no complaints.  Contractions: Not present. Vag. Bleeding: None.  Movement: Present. Denies leaking of fluid/ROM.   The following portions of the patient's history were reviewed and updated as appropriate: allergies, current medications, past family history, past medical history, past social history, past surgical history and problem list. Problem list updated.  Objective:   Vitals:   06/06/21 1551  BP: (!) 87/50  Pulse: 80  Temp: (!) 37.7 F (3.2 C)  Weight: 130 lb 9.6 oz (59.2 kg)    Fetal Status: Fetal Heart Rate (bpm): 130 Fundal Height: 34 cm Movement: Present     General:  Alert, oriented and cooperative. Patient is in no acute distress.  Skin: Skin is warm and dry. No rash noted.   Cardiovascular: Normal heart rate noted  Respiratory: Normal respiratory effort, no problems with respiration noted  Abdomen: Soft, gravid, appropriate for gestational age.  Pain/Pressure: Absent     Pelvic: Cervical exam deferred        Extremities: Normal range of motion.  Edema: None  Mental Status: Normal mood and affect. Normal behavior. Normal judgment and thought content.   Assessment and Plan:  Pregnancy: G4P1021 at [redacted]w[redacted]d  1. History of cesarean delivery [facility] telehealth apt for repeat c/s 06/12/21  2. Abnormal glucose tolerance in pregnancy Equivocal 3 hour GTT 05/11/21.  ADA diet counseling done 05/19/21  3. Encounter for supervision of high-risk pregnancy with multigravida of advanced maternal age Feels well. Working 40 hrs/wk 13 lb 9.6 oz (6.169 kg)   4. Antepartum multigravida of advanced maternal age: 40 yo Declined ASA 81 mg Declined genetic counseling   Preterm labor symptoms and general obstetric precautions including but not limited to vaginal bleeding, contractions, leaking of fluid and fetal movement were reviewed in detail with the patient. Please refer to After Visit Summary for other counseling recommendations.  No follow-ups on file.  Future Appointments  Date Time Provider Department Center  06/20/2021  4:00 PM AC-MH PROVIDER AC-MAT None    Alberteen Spindle, CNM

## 2021-06-20 ENCOUNTER — Ambulatory Visit: Payer: BC Managed Care – PPO | Admitting: Advanced Practice Midwife

## 2021-06-20 ENCOUNTER — Other Ambulatory Visit: Payer: Self-pay

## 2021-06-20 VITALS — BP 93/43 | HR 74 | Temp 98.3°F | Wt 130.2 lb

## 2021-06-20 DIAGNOSIS — O09529 Supervision of elderly multigravida, unspecified trimester: Secondary | ICD-10-CM

## 2021-06-20 DIAGNOSIS — Z98891 History of uterine scar from previous surgery: Secondary | ICD-10-CM

## 2021-06-20 NOTE — Progress Notes (Signed)
Here today for 34.3 week MH RV. Taking PNV QD. Denies ED/hospital visits since last RV. Tawny Hopping, RN

## 2021-06-20 NOTE — Progress Notes (Signed)
   PRENATAL VISIT NOTE  Subjective:  Ariana Hall is a 40 y.o. [phone] at [redacted]w[redacted]d being seen today for ongoing prenatal care.  She is currently monitored for the following issues for this high-risk pregnancy and has Herpes genitalis; Encounter for supervision of high-risk pregnancy with multigravida of advanced maternal age; History of cesarean delivery; History of gestational diabetes; Antepartum multigravida of advanced maternal age: 40 yo; Rh negative state in antepartum period; UTI in pregnancy, antepartum 01/12/21 >100,000 E. Coli; and Abnormal glucose tolerance in pregnancy on their problem list.  Patient reports no complaints.  Contractions: Not present. Vag. Bleeding: None.  Movement: Present. Denies leaking of fluid/ROM.   The following portions of the patient's history were reviewed and updated as appropriate: allergies, current medications, past family history, past medical history, past social history, past surgical history and problem list. Problem list updated.  Objective:   Vitals:   06/20/21 1551  BP: (!) 93/43  Pulse: 74  Temp: 98.3 F (36.8 C)  Weight: 130 lb 3.2 oz (59.1 kg)    Fetal Status: Fetal Heart Rate (bpm): 140 Fundal Height: 36 cm Movement: Present  Presentation: Vertex  General:  Alert, oriented and cooperative. Patient is in no acute distress.  Skin: Skin is warm and dry. No rash noted.   Cardiovascular: Normal heart rate noted  Respiratory: Normal respiratory effort, no problems with respiration noted  Abdomen: Soft, gravid, appropriate for gestational age.  Pain/Pressure: Absent     Pelvic: Cervical exam deferred        Extremities: Normal range of motion.  Edema: None  Mental Status: Normal mood and affect. Normal behavior. Normal judgment and thought content.   Assessment and Plan:  Pregnancy: G4P1021 at [redacted]w[redacted]d 1. History of cesarean delivery C/s scheduled 07/24/21 Wants BTL (has insurance)  2. Encounter for supervision of high-risk pregnancy  with multigravida of advanced maternal age Working 17 hours/wk Feels well but tired BP 93/43. FH wnl  3. Antepartum multigravida of advanced maternal age: 40 yo      Preterm labor symptoms and general obstetric precautions including but not limited to vaginal bleeding, contractions, leaking of fluid and fetal movement were reviewed in detail with the patient. Please refer to After Visit Summary for other counseling recommendations.  No follow-ups on file.  Future Appointments  Date Time Provider Department Center  07/04/2021  4:00 PM AC-MH PROVIDER AC-MAT None    Alberteen Spindle, CNM

## 2021-07-03 ENCOUNTER — Telehealth: Payer: Self-pay

## 2021-07-03 NOTE — Telephone Encounter (Signed)
Call transferred to clinic by Marlene Yemen as client reported recent illness during 07/04/2021 appt reminder call. Per client, youngest child recently had ear infection with runny nose and treated with an antibiotic by the pediatrician. Client reports onset of non-productive cough with intermittent nasal congestion and runny nose that started 3 days ago. Sore throat x2 days that has resolved spontaneously. States fluid from nose is clear to yellowish. Reports malaise is improving. Consult with E. Sciora CNM regarding above and per her recommendation, Covid test is needed. Client aware and provided [facility] testing site location address and hours. Encouraged to call pharmacies to determine if could receive test tomorrow.Client denies fever, HA, forehead or facial pain. Jossie Ng, RN

## 2021-07-04 ENCOUNTER — Ambulatory Visit: Payer: Self-pay

## 2021-07-07 ENCOUNTER — Other Ambulatory Visit: Payer: Self-pay

## 2021-07-07 ENCOUNTER — Ambulatory Visit: Payer: BC Managed Care – PPO | Admitting: Family Medicine

## 2021-07-07 VITALS — BP 96/54 | HR 68 | Temp 97.9°F | Wt 135.2 lb

## 2021-07-07 DIAGNOSIS — B009 Herpesviral infection, unspecified: Secondary | ICD-10-CM

## 2021-07-07 DIAGNOSIS — O09529 Supervision of elderly multigravida, unspecified trimester: Secondary | ICD-10-CM

## 2021-07-07 MED ORDER — ACYCLOVIR 800 MG PO TABS: 400.0000 mg | ORAL_TABLET | Freq: Two times a day (BID) | ORAL | 1 refills | Status: AC

## 2021-07-07 NOTE — Progress Notes (Signed)
   PRENATAL VISIT NOTE  Subjective:  Ariana Hall is a 40 y.o. [phone] at [redacted]w[redacted]d being seen today for ongoing prenatal care.  She is currently monitored for the following issues for this high-risk pregnancy and has Herpes genitalis; Encounter for supervision of high-risk pregnancy with multigravida of advanced maternal age; History of cesarean delivery; History of gestational diabetes; Antepartum multigravida of advanced maternal age: 40 yo; Rh negative state in antepartum period; UTI in pregnancy, antepartum 01/12/21 >100,000 E. Coli; and Abnormal glucose tolerance in pregnancy on their problem list.  Patient reports  burning in lateral right upper leg, feels it mostly after standing or working .  Contractions: Irritability. Vag. Bleeding: None.  Movement: Present. Denies leaking of fluid/ROM.   The following portions of the patient's history were reviewed and updated as appropriate: allergies, current medications, past family history, past medical history, past social history, past surgical history and problem list. Problem list updated.  Objective:   Vitals:   07/07/21 1615  BP: (!) 96/54  Pulse: 68  Temp: 97.9 F (36.6 C)  Weight: 135 lb 3.2 oz (61.3 kg)    Fetal Status: Fetal Heart Rate (bpm): 145 Fundal Height: 38 cm Movement: Present  Presentation: Vertex  General:  Alert, oriented and cooperative. Patient is in no acute distress.  Skin: Skin is warm and dry. No rash noted.   Cardiovascular: Normal heart rate noted  Respiratory: Normal respiratory effort, no problems with respiration noted  Abdomen: Soft, gravid, appropriate for gestational age.  Pain/Pressure: Present     Pelvic: Cervical exam deferred        Extremities: Normal range of motion.  Edema: None  Mental Status: Normal mood and affect. Normal behavior. Normal judgment and thought content.   Assessment and Plan:  Pregnancy: G4P1021 at [redacted]w[redacted]d  1. Encounter for supervision of high-risk pregnancy with  multigravida of advanced maternal age 48 week labs today  Caesarean scheduled for 07/24/21.  Pt desires BTL.   Pt working 40 hrs per week.  Reports burning in lateral right upper leg intermittently. Baby head is pressing on right inguinal area.    - Chlamydia/GC NAA, Confirmation - Culture, beta strep (group b only)  2. HSV-2 (herpes simplex virus 2) infection Start suppressive therapy for HSV take until delivery.   - acyclovir (ZOVIRAX) 800 MG tablet; Take 0.5 tablets (400 mg total) by mouth 2 (two) times daily.  Dispense: 60 tablet; Refill: 1   Preterm labor symptoms and general obstetric precautions including but not limited to vaginal bleeding, contractions, leaking of fluid and fetal movement were reviewed in detail with the patient. Please refer to After Visit Summary for other counseling recommendations.  Return in about 1 week (around 07/14/2021) for routine prenatal care.  Future Appointments  Date Time Provider Department Center  07/14/2021  3:20 PM [facility] PROVIDER [facility] None    Wendi Snipes, FNP

## 2021-07-07 NOTE — Progress Notes (Signed)
Patient here for [facility] RV at 36 6/7. States she had negative covid test earlier this week, on Tuesday, 07/05/2021. Patient self-collecting labs today, 36 week packet given and reviewed.Burt Knack, RN

## 2021-07-10 DIAGNOSIS — B951 Streptococcus, group B, as the cause of diseases classified elsewhere: Secondary | ICD-10-CM | POA: Insufficient documentation

## 2021-07-10 LAB — CHLAMYDIA/GC NAA, CONFIRMATION
Chlamydia trachomatis, NAA: NEGATIVE
Neisseria gonorrhoeae, NAA: NEGATIVE

## 2021-07-10 LAB — CULTURE, BETA STREP (GROUP B ONLY): Strep Gp B Culture: POSITIVE — AB

## 2021-07-14 ENCOUNTER — Ambulatory Visit: Payer: BC Managed Care – PPO

## 2021-07-14 ENCOUNTER — Ambulatory Visit: Payer: BC Managed Care – PPO | Admitting: Advanced Practice Midwife

## 2021-07-14 ENCOUNTER — Other Ambulatory Visit: Payer: Self-pay

## 2021-07-14 VITALS — BP 103/57 | HR 66 | Temp 97.9°F | Wt 134.6 lb

## 2021-07-14 DIAGNOSIS — O09523 Supervision of elderly multigravida, third trimester: Secondary | ICD-10-CM

## 2021-07-14 DIAGNOSIS — A6000 Herpesviral infection of urogenital system, unspecified: Secondary | ICD-10-CM

## 2021-07-14 DIAGNOSIS — O09529 Supervision of elderly multigravida, unspecified trimester: Secondary | ICD-10-CM

## 2021-07-14 DIAGNOSIS — Z98891 History of uterine scar from previous surgery: Secondary | ICD-10-CM

## 2021-07-14 NOTE — Progress Notes (Signed)
   PRENATAL VISIT NOTE  Subjective:  Ariana Hall is a 40 y.o. [phone] at [redacted]w[redacted]d being seen today for ongoing prenatal care.  She is currently monitored for the following issues for this high-risk pregnancy and has Herpes genitalis; Encounter for supervision of high-risk pregnancy with multigravida of advanced maternal age; History of cesarean delivery; History of gestational diabetes; Antepartum multigravida of advanced maternal age: 40 yo; Rh negative state in antepartum period; UTI in pregnancy, antepartum 01/12/21 >100,000 E. Coli; Abnormal glucose tolerance in pregnancy; and Positive GBS test on their problem list.  Patient reports no complaints.   .  .   . Denies leaking of fluid/ROM.   The following portions of the patient's history were reviewed and updated as appropriate: allergies, current medications, past family history, past medical history, past social history, past surgical history and problem list. Problem list updated.  Objective:   Vitals:   07/14/21 1539  BP: (!) 103/57  Pulse: 66  Temp: 97.9 F (36.6 C)  Weight: 134 lb 9.6 oz (61.1 kg)    Fetal Status:           General:  Alert, oriented and cooperative. Patient is in no acute distress.  Skin: Skin is warm and dry. No rash noted.   Cardiovascular: Normal heart rate noted  Respiratory: Normal respiratory effort, no problems with respiration noted  Abdomen: Soft, gravid, appropriate for gestational age.        Pelvic: Cervical exam deferred        Extremities: Normal range of motion.     Mental Status: Normal mood and affect. Normal behavior. Normal judgment and thought content.   Assessment and Plan:  Pregnancy: G4P1021 at [redacted]w[redacted]d  1. History of cesarean delivery C/s scheduled for 07/24/21  2. Encounter for supervision of high-risk pregnancy with multigravida of advanced maternal age Working 9 hrs/wk. Has car seat Wants BTL Has [facility] apt 07/21/21 for covid test  3. Herpes simplex infection of  genitourinary system Taking Acyclovir BID  4. Antepartum multigravida of advanced maternal age: 40 yo    Preterm labor symptoms and general obstetric precautions including but not limited to vaginal bleeding, contractions, leaking of fluid and fetal movement were reviewed in detail with the patient. Please refer to After Visit Summary for other counseling recommendations.  No follow-ups on file.  Future Appointments  Date Time Provider Department Center  07/20/2021  3:20 PM [facility] PROVIDER [facility] None    Alberteen Spindle, CNM

## 2021-07-14 NOTE — Progress Notes (Signed)
Patient here for 37 6/7. GBS counseling done and literature given. Patient scheduled for next RV on 07/20/2021, states she has an appointment at [facility] on 07/21/2021 in the pm..Subrina Vecchiarelli [place], RN

## 2021-07-19 ENCOUNTER — Telehealth: Payer: Self-pay

## 2021-07-19 NOTE — Telephone Encounter (Signed)
Client aware of [facility] RV appt tomorrow. Jossie Ng, RN

## 2021-07-19 NOTE — Telephone Encounter (Signed)
Call transferred to [facility] from clerical. Per client,at approximately 1100 today, passed small amt brownish colored mucus and has subsequently had clear mucus on tissue today several times after voiding. C/O intermittent low back pain that she states is "not bad'. Counseled most likely has passed mucus plug. Client has C-section scheduled for 07/24/2021 and counseled that if begins to have contractions, to seek L & D evaluation. Client a

## 2021-07-20 ENCOUNTER — Ambulatory Visit: Payer: BC Managed Care – PPO

## 2021-07-20 ENCOUNTER — Telehealth: Payer: Self-pay

## 2021-07-20 DIAGNOSIS — O09523 Supervision of elderly multigravida, third trimester: Secondary | ICD-10-CM

## 2021-07-20 NOTE — Telephone Encounter (Signed)
LVM regarding the chance for patient to arrive early for [facility] RV appointment. Left call back number if patient wanted to come in early today.   Floy Sabina, RN

## 2021-07-26 ENCOUNTER — Encounter: Payer: Self-pay | Admitting: Advanced Practice Midwife

## 2021-07-26 DIAGNOSIS — Z9851 Tubal ligation status: Secondary | ICD-10-CM | POA: Insufficient documentation

## 2021-07-28 ENCOUNTER — Telehealth: Payer: Self-pay

## 2021-07-28 NOTE — Telephone Encounter (Signed)
TC to patient to inform her that her FMLA paper work is ready to be picked up. Copy sent for scanning and patient copy on nurse to do cart. Marland KitchenBurt Knack, RN

## 2021-08-31 ENCOUNTER — Ambulatory Visit: Payer: BC Managed Care – PPO | Admitting: Physician Assistant

## 2021-08-31 ENCOUNTER — Encounter: Payer: Self-pay | Admitting: Physician Assistant

## 2021-08-31 ENCOUNTER — Other Ambulatory Visit: Payer: Self-pay

## 2021-08-31 VITALS — BP 106/65 | HR 62 | Temp 97.4°F | Ht <= 58 in | Wt 117.0 lb

## 2021-08-31 DIAGNOSIS — Z3009 Encounter for other general counseling and advice on contraception: Secondary | ICD-10-CM

## 2021-08-31 LAB — HEMOGLOBIN, FINGERSTICK: Hemoglobin: 12.2 g/dL (ref 11.1–15.9)

## 2021-08-31 NOTE — Progress Notes (Signed)
Post Partum Exam  Ariana Hall is a 40 y.o. [phone] female who presents for a postpartum visit. She is 6 weeks postpartum following a low cervical transverse Cesarean section. I have fully reviewed the prenatal and intrapartum course. The delivery was at 38 5/7 gestational weeks.  Anesthesia: epidural and spinal. Postpartum course has been uneventful. Baby's course has been uneventful. Baby is feeding by Bottle and Breast Bleeding no bleeding. Bowel function is normal. Bladder function is normal. Patient is sexually active. Contraception method is tubal ligation.   Postpartum depression screening: EPDS score = 0  The following portions of the patient's history were reviewed and updated as appropriate: allergies, current medications, past family history, past medical history, past social history, past surgical history, and problem list. Last pap smear done 01/04/21 and was Normal  Review of Systems A comprehensive review of systems was negative.    Objective:  BP 106/65   Pulse 62   Temp (!) 97.4 F (36.3 C)   Ht 4\' 10"  (1.473 m)   Wt 117 lb (53.1 kg)   Breastfeeding No Comment: breast fed x1 month  BMI 24.45 kg/m   Gen: well appearing, NAD HEENT: no scleral icterus CV: RR Lung: Normal WOB Breast:performed-yes No masses, tenderness, skin changes or lymphadenopathy. Ext: warm well perfused Abdomen with steristrips in place across 10cm horiz suprapubic incision. These are removed without pain. Incision appears well-healed: no tenderness, erythema, drainage or swelling. GU: EGBUS WNL, vaginal without tenderness, lesion or discharge. Cervix nontender, no lesions or ectropion. Uterus nontender, sl enlarged. No adnexal tenderness. Rectal: performed -   no exernal hemorrhoids       Assessment:    Normal postpartum exam. Pap smear not done at today's visit.   Plan:   Essential components of care per ACOG recommendations for Comprehensive Postpartum exam:  1.  Mood and well  being: Patient with negative depression screening today. Reviewed local resources for support. EPDS is low risk. Reviewed resources and that mood sx in first year after pregnancy are considered related to pregnancy and to reach out for help at [facility] if needed. Discussed [facility] as link to care and availability of LCSW for counseling  - Patient does not use tobacco. If using tobacco we discussed reduction and for recently cessation risk of relapse - hx of drug use? No   If yes, discussed support systems in place  2. Infant care and feeding:  -Patient currently breastmilk feeding? No: weaned baby several weeks ago. -Social determinants of health (SDOH) reviewed in EPIC.  3. Sexuality, contraception and birth spacing  Contraception: Contraception counseling: Reviewed all forms of birth control options in the tiered based approach. available including abstinence; over the counter/barrier methods; hormonal contraceptive medication including pill, patch, ring, injection,contraceptive implant; hormonal and nonhormonal IUDs; permanent sterilization options including vasectomy and the various tubal sterilization modalities. Risks, benefits, and typical effectiveness rates were reviewed.  Questions were answered.  Written information was also given to the patient to review.  Patient desires to continue BTL. She is encouraged to establish primary care provider for future well-woman care, as she is ineligible for that care here under current policies due to her BTL. Emphasized use of condoms 100% of the time for STI prevention.  - Patient does not want a pregnancy in the next year.  Desired family size is 2 children.  - Reviewed forms of contraception in tiered fashion. Patient desired bilateral tubal ligation today.   - Discussed birth spacing of 18 months  4. Sleep and fatigue -Encouraged family/partner/community support of 4 hrs of uninterrupted sleep to help with mood and fatigue  5. Physical Recovery  -  Discussed patients delivery and lack of complications - Patient has urinary incontinence? No  - Patient will be safe to resume sexual activity now and physical at 10/12/21, FMLA forms are annotated today to extend leave to allow full recovery for 12 weeks following C/S.  6.  Health Maintenance/Chronic Disease Health Maintenance Due  Topic Date Due   HIV Screening  Never done   INFLUENZA VACCINE  07/03/2021   - Last pap smear performed 01/04/21 and was normal with negative HPV. Encourage annual well-woman visit in 12 mo.   Patient given handout about PCP care in the community Given MVI per family planning program guidelines and availability  Follow up in:  12  months or as needed.

## 2021-08-31 NOTE — Progress Notes (Signed)
Hgb = 12.2 and no interventions required. Jossie Ng, RN

## 2022-03-16 IMAGING — US US OB < 14 WEEKS - US OB TV
1 series · 13 of 28 positions shown · non-contrast
Comparison: Prior gestational ultrasound 03/05/2018

CLINICAL DATA: Vaginal bleeding, threatened miscarriage, 130D6D,
quantitative HCG 64336

EXAM:
OBSTETRIC <14 WK US AND TRANSVAGINAL OB US
TECHNIQUE: Both transabdominal and transvaginal ultrasound examinations were
performed for complete evaluation of the gestation as well as the
maternal uterus, adnexal regions, and pelvic cul-de-sac.
Transvaginal technique was performed to assess early pregnancy.

[Series 1: ob us · 13 of 89 slices shown]
[im 4/89]
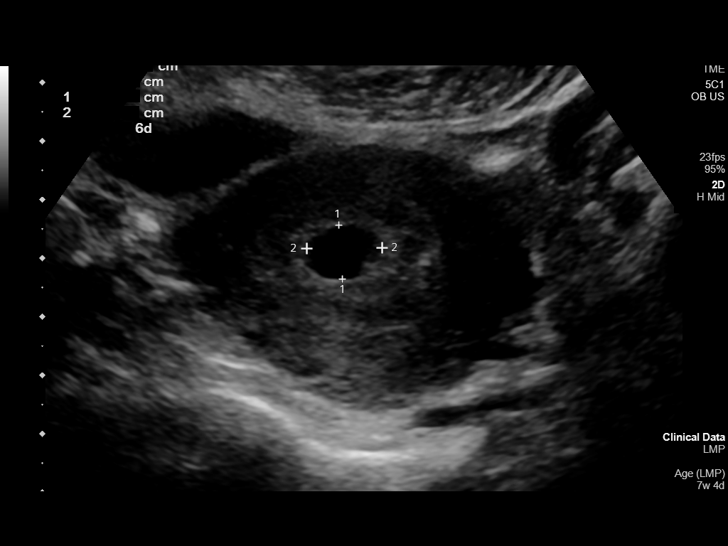
[im 10/89]
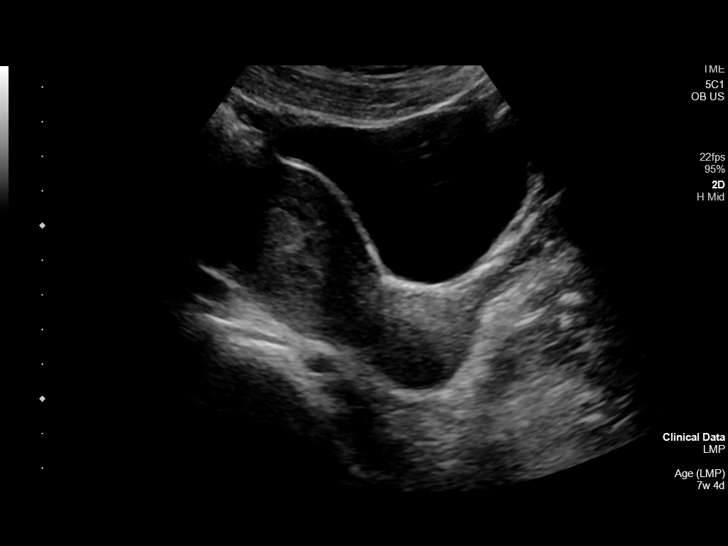
[im 17/89]
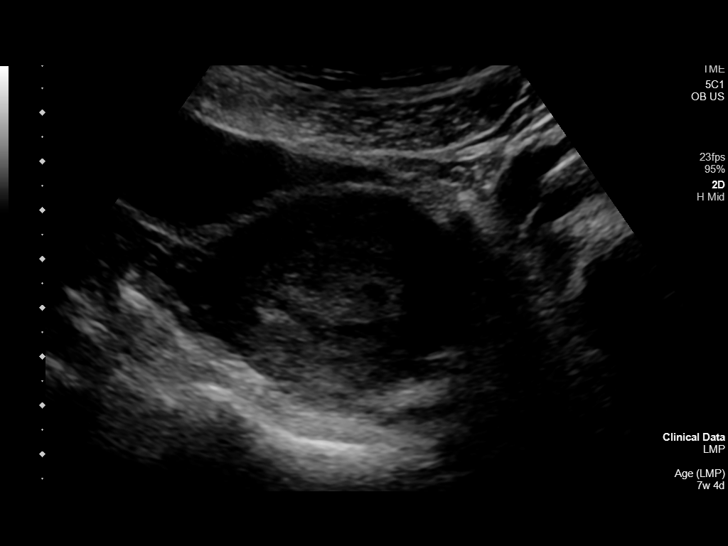
[im 23/89]
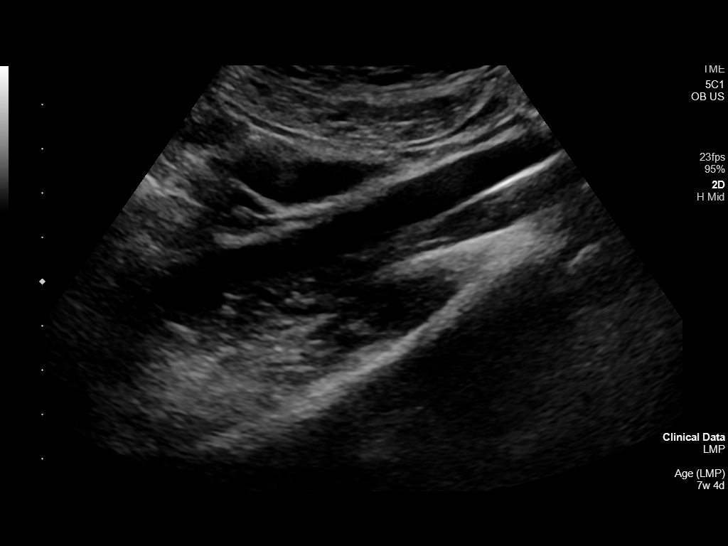
[im 30/89]
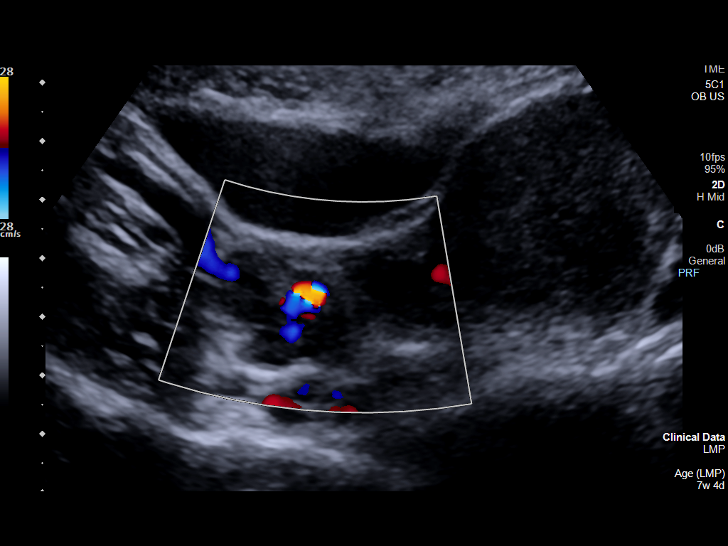
[im 36/89]
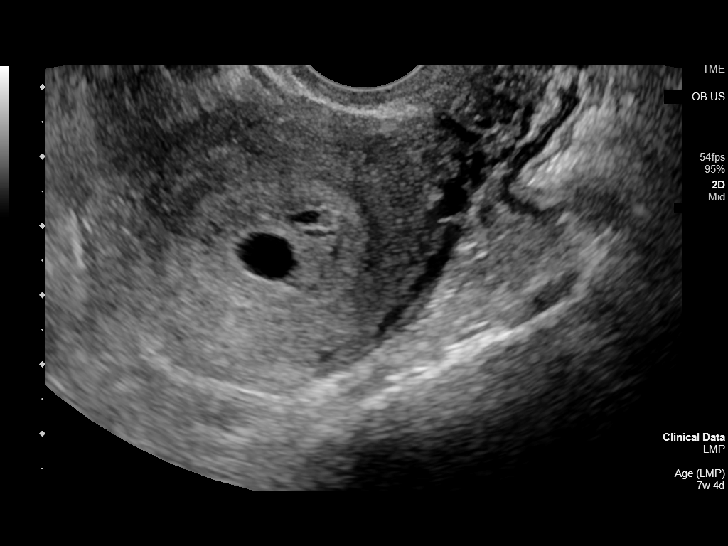
[im 46/89]
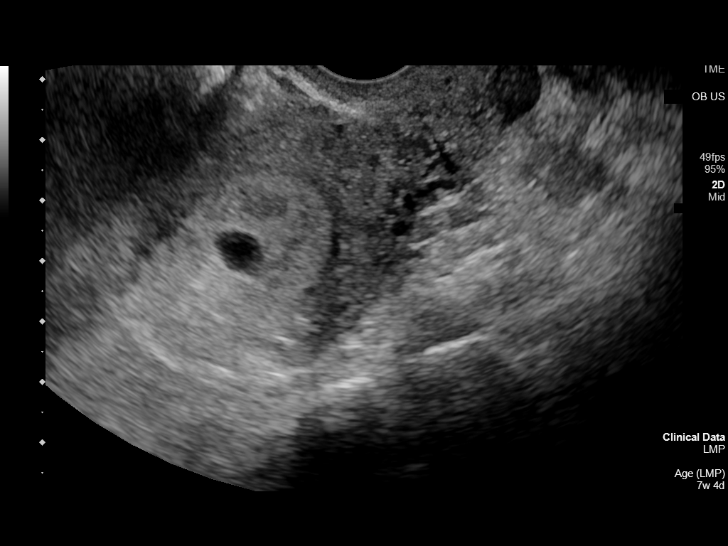
[im 53/89]
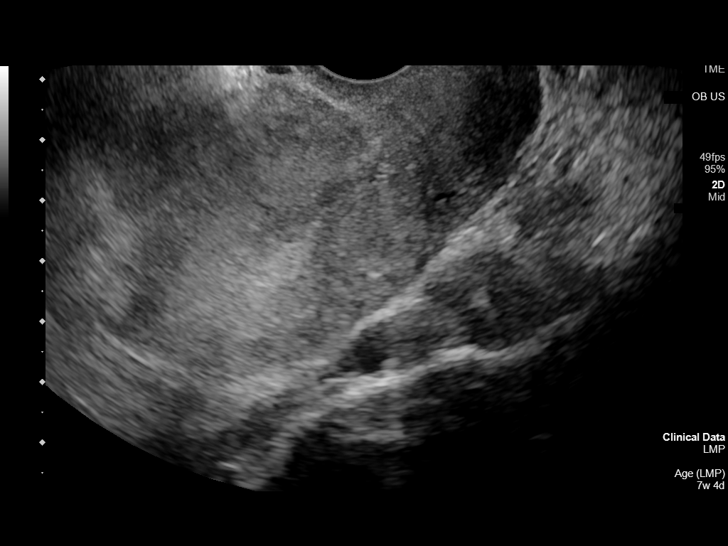
[im 59/89]
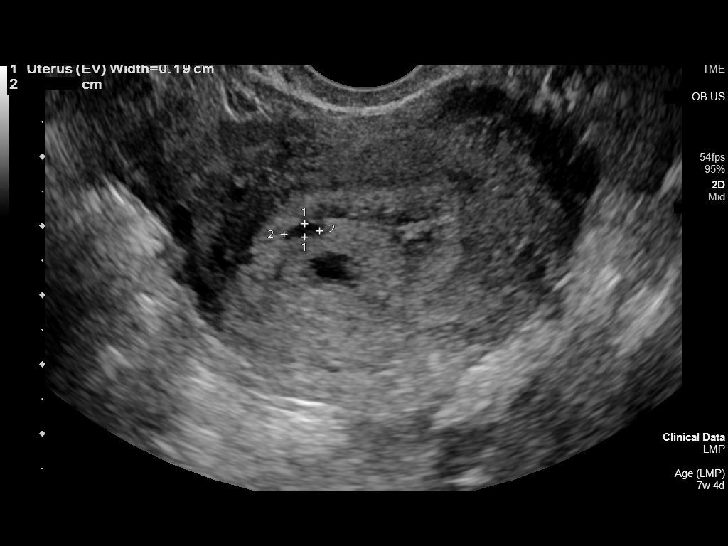
[im 66/89]
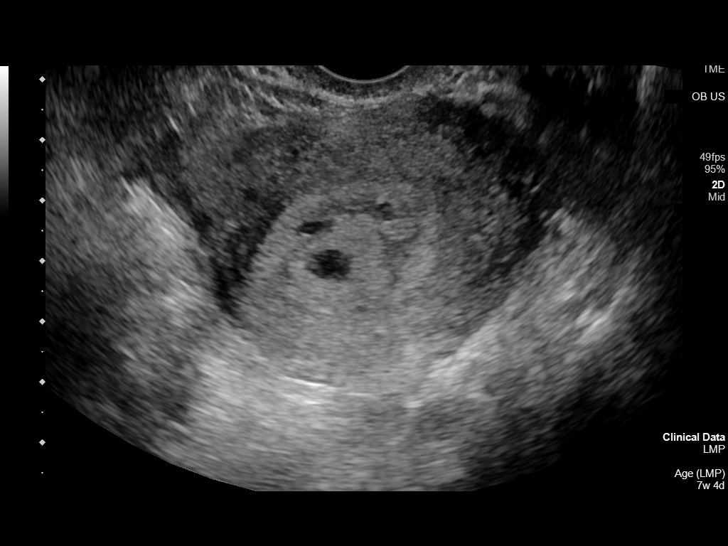
[im 72/89]
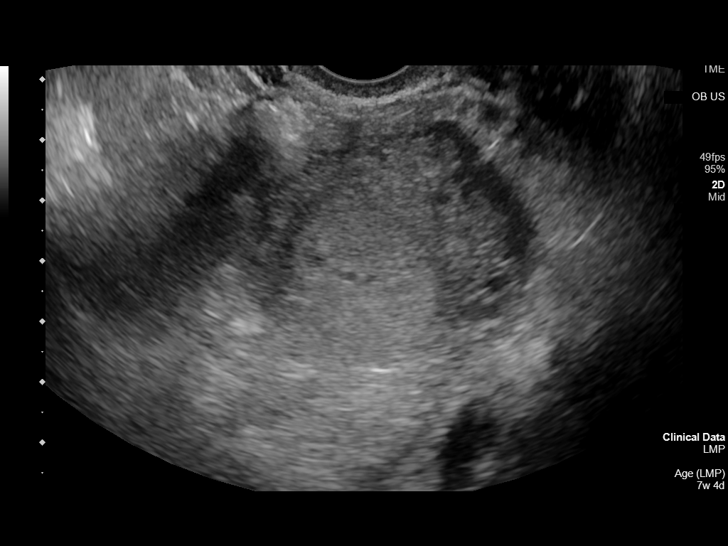
[im 79/89]
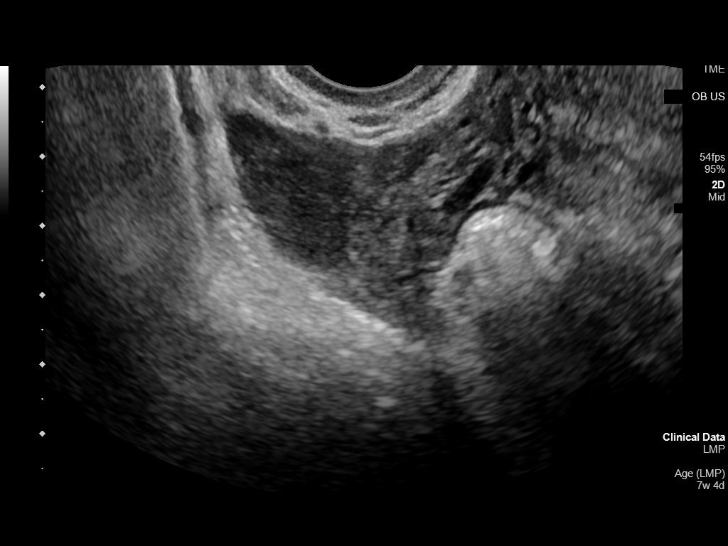
[im 85/89]
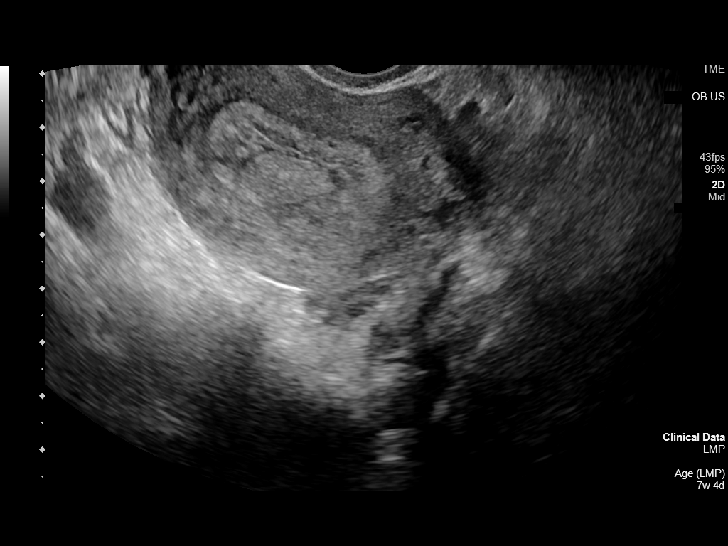

[13 of 28 positions shown; findings below may reference images not displayed]

FINDINGS: Intrauterine gestational sac: Single

Yolk sac:  Not Visualized.

Embryo:  Not Visualized.

Cardiac Activity: Not Visualized.

MSD: 8.9 mm   5 w   5 d

Subchorionic hemorrhage: Small volume subchorionic hemorrhage near
the implantation site measuring 6 x 2 x 5 mm.

Maternal uterus/adnexae: Normal anteverted uterus. No other
concerning uterine abnormality. Probable corpus luteum seen in the
right ovary. Left ovary is not well visualized. No gross abnormality
seen in the left adnexa. No free fluid.
IMPRESSION: Possible early intrauterine gestational sac, but no yolk sac, fetal
pole, or cardiac activity yet visualized. Recommend close clinical
followup and serial quantitative beta HCGs and ultrasounds.
Recommend follow-up quantitative B-HCG levels and follow-up US in 14
days to assess viability. This recommendation follows SRU consensus
guidelines: Diagnostic Criteria for Nonviable Pregnancy Early in the
First Trimester. N Engl J Med 9961; [DATE].

## 2022-11-24 ENCOUNTER — Other Ambulatory Visit: Payer: Self-pay

## 2022-11-24 ENCOUNTER — Emergency Department
Admission: EM | Admit: 2022-11-24 | Discharge: 2022-11-24 | Disposition: A | Discharge status: Home / Self Care | Payer: BC Managed Care – PPO | Attending: Emergency Medicine | Admitting: Emergency Medicine

## 2022-11-24 DIAGNOSIS — Z1152 Encounter for screening for COVID-19: Secondary | ICD-10-CM | POA: Insufficient documentation

## 2022-11-24 DIAGNOSIS — J101 Influenza due to other identified influenza virus with other respiratory manifestations: Secondary | ICD-10-CM

## 2022-11-24 DIAGNOSIS — R059 Cough, unspecified: Secondary | ICD-10-CM | POA: Diagnosis present

## 2022-11-24 LAB — RESP PANEL BY RT-PCR (RSV, FLU A&B, COVID)  RVPGX2
Influenza A by PCR: POSITIVE — AB
Influenza B by PCR: NEGATIVE
Resp Syncytial Virus by PCR: NEGATIVE
SARS Coronavirus 2 by RT PCR: NEGATIVE

## 2022-11-24 MED ORDER — FLUTICASONE PROPIONATE 50 MCG/ACT NA SUSP: 1.0000 | Freq: Every day | NASAL | 2 refills | Status: DC

## 2022-11-24 MED ORDER — BENZONATATE 100 MG PO CAPS: 100.0000 mg | ORAL_CAPSULE | Freq: Three times a day (TID) | ORAL | 0 refills | Status: AC | PRN

## 2022-11-24 MED ORDER — ALBUTEROL SULFATE HFA 108 (90 BASE) MCG/ACT IN AERS: 2.0000 | INHALATION_SPRAY | Freq: Four times a day (QID) | RESPIRATORY_TRACT | 2 refills | Status: DC | PRN

## 2022-11-24 NOTE — ED Triage Notes (Signed)
Pt to Ed for home. Pt had flu like symptoms. Had fever yesterday. No fever today. Pt is CAOx4 and in no acute distress.

## 2022-11-24 NOTE — ED Provider Notes (Signed)
[facility] Provider Note  Patient Contact: 4:04 PM (approximate)   History   flu-like symptoms   HPI  Ariana Hall is a 41 y.o. female presents to the emergency department with cough, nasal congestion and fever for the past 2 days.  No chest pain, chest tightness or abdominal pain.  No vomiting or diarrhea.      Physical Exam   Triage Vital Signs: ED Triage Vitals  Enc Vitals Group     BP 11/24/22 1355 (!) 69/49     Pulse Rate 11/24/22 1355 82     Resp 11/24/22 1355 16     Temp 11/24/22 1355 98 F (36.7 C)     Temp Source 11/24/22 1355 Oral     SpO2 11/24/22 1355 100 %     Weight 11/24/22 1356 115 lb (52.2 kg)     Height 11/24/22 1356 4\' 10"  (1.473 m)     Head Circumference --      Peak Flow --      Pain Score 11/24/22 1356 0     Pain Loc --      Pain Edu? --      Excl. in GC? --     Most recent vital signs: Vitals:   11/24/22 1355 11/24/22 1357  BP: (!) 69/49 103/70  Pulse: 82   Resp: 16   Temp: 98 F (36.7 C)   SpO2: 100%      Constitutional: Alert and oriented. Patient is lying supine. Eyes: Conjunctivae are normal. PERRL. EOMI. Head: Atraumatic. ENT:      Ears: Tympanic membranes are mildly injected with mild effusion bilaterally.       Nose: No congestion/rhinnorhea.      Mouth/Throat: Mucous membranes are moist. Posterior pharynx is mildly erythematous.  Hematological/Lymphatic/Immunilogical: No cervical lymphadenopathy.  Cardiovascular: Normal rate, regular rhythm. Normal S1 and S2.  Good peripheral circulation. Respiratory: Normal respiratory effort without tachypnea or retractions. Lungs CTAB. Good air entry to the bases with no decreased or absent breath sounds. Gastrointestinal: Bowel sounds 4 quadrants. Soft and nontender to palpation. No guarding or rigidity. No palpable masses. No distention. No CVA tenderness. Musculoskeletal: Full range of motion to all extremities. No gross deformities  appreciated. Neurologic:  Normal speech and language. No gross focal neurologic deficits are appreciated.  Skin:  Skin is warm, dry and intact. No rash noted. Psychiatric: Mood and affect are normal. Speech and behavior are normal. Patient exhibits appropriate insight and judgement.   ED Results / Procedures / Treatments   Labs (all labs ordered are listed, but only abnormal results are displayed) Labs Reviewed  RESP PANEL BY RT-PCR (RSV, FLU A&B, COVID)  RVPGX2 - Abnormal; Notable for the following components:      Result Value   Influenza A by PCR POSITIVE (*)    All other components within normal limits        PROCEDURES:  Critical Care performed: No  Procedures   MEDICATIONS ORDERED IN ED: Medications - No data to display   IMPRESSION / MDM / ASSESSMENT AND PLAN / ED COURSE  I reviewed the triage vital signs and the nursing notes.                              Assessment and plan Influenza A 41 year old female presents to the emergency department with flulike symptoms.  Vital signs are reassuring at triage.  On exam, patient was alert and nontoxic-appearing.  Patient was discharged with Jerilynn Som, Flonase and albuterol inhaler.  Return precautions were given to return with new or worsening symptoms.     FINAL CLINICAL IMPRESSION(S) / ED DIAGNOSES   Final diagnoses:  Influenza A     Rx / DC Orders   ED Discharge Orders          Ordered    benzonatate (TESSALON PERLES) 100 MG capsule  3 times daily PRN        11/24/22 1602    fluticasone (FLONASE) 50 MCG/ACT nasal spray  Daily        11/24/22 1602    albuterol (VENTOLIN HFA) 108 (90 Base) MCG/ACT inhaler  Every 6 hours PRN        11/24/22 1602             Note:  This document was prepared using Dragon voice recognition software and may include unintentional dictation errors.   Pia Mau Elton, PA-C 11/24/22 1605    Pilar Jarvis, MD 11/24/22 2214

## 2022-11-24 NOTE — ED Notes (Signed)
Pt advised she was NEG at home for COVID but is only here for FLU test.

## 2023-03-29 ENCOUNTER — Ambulatory Visit
Admission: RE | Admit: 2023-03-29 | Discharge: 2023-03-29 | Disposition: A | Discharge status: Home / Self Care | Payer: Worker's Compensation | Source: Ambulatory Visit | Attending: Physician Assistant | Admitting: Physician Assistant

## 2023-03-29 ENCOUNTER — Other Ambulatory Visit: Payer: Self-pay | Admitting: Physician Assistant

## 2023-03-29 DIAGNOSIS — T1490XA Injury, unspecified, initial encounter: Secondary | ICD-10-CM | POA: Insufficient documentation

## 2024-02-10 ENCOUNTER — Emergency Department
Admission: EM | Admit: 2024-02-10 | Discharge: 2024-02-10 | Disposition: A | Discharge status: Home / Self Care | Attending: Emergency Medicine | Admitting: Emergency Medicine

## 2024-02-10 ENCOUNTER — Other Ambulatory Visit: Payer: Self-pay

## 2024-02-10 DIAGNOSIS — K429 Umbilical hernia without obstruction or gangrene: Secondary | ICD-10-CM | POA: Insufficient documentation

## 2024-02-10 DIAGNOSIS — R1033 Periumbilical pain: Secondary | ICD-10-CM

## 2024-02-10 LAB — CBC
HCT: 35.8 % — ABNORMAL LOW (ref 36.0–46.0)
Hemoglobin: 12 g/dL (ref 12.0–15.0)
MCH: 29.5 pg (ref 26.0–34.0)
MCHC: 33.5 g/dL (ref 30.0–36.0)
MCV: 88 fL (ref 80.0–100.0)
Platelets: 267 10*3/uL (ref 150–400)
RBC: 4.07 MIL/uL (ref 3.87–5.11)
RDW: 13.2 % (ref 11.5–15.5)
WBC: 6.6 10*3/uL (ref 4.0–10.5)
nRBC: 0 % (ref 0.0–0.2)

## 2024-02-10 LAB — URINALYSIS, ROUTINE W REFLEX MICROSCOPIC
Bilirubin Urine: NEGATIVE
Glucose, UA: NEGATIVE mg/dL
Ketones, ur: NEGATIVE mg/dL
Leukocytes,Ua: NEGATIVE
Nitrite: NEGATIVE
Protein, ur: NEGATIVE mg/dL
Specific Gravity, Urine: 1.025 (ref 1.005–1.030)
pH: 5 (ref 5.0–8.0)

## 2024-02-10 LAB — COMPREHENSIVE METABOLIC PANEL
ALT: 22 U/L (ref 0–44)
AST: 26 U/L (ref 15–41)
Albumin: 3.8 g/dL (ref 3.5–5.0)
Alkaline Phosphatase: 79 U/L (ref 38–126)
Anion gap: 6 (ref 5–15)
BUN: 13 mg/dL (ref 6–20)
CO2: 24 mmol/L (ref 22–32)
Calcium: 8.6 mg/dL — ABNORMAL LOW (ref 8.9–10.3)
Chloride: 107 mmol/L (ref 98–111)
Creatinine, Ser: 0.6 mg/dL (ref 0.44–1.00)
GFR, Estimated: >60 mL/min (ref >60)
Glucose, Bld: 118 mg/dL — ABNORMAL HIGH (ref 70–99)
Potassium: 3.7 mmol/L (ref 3.5–5.1)
Sodium: 137 mmol/L (ref 135–145)
Total Bilirubin: 0.3 mg/dL (ref 0.0–1.2)
Total Protein: 7.4 g/dL (ref 6.5–8.1)

## 2024-02-10 LAB — POC URINE PREG, ED: Preg Test, Ur: NEGATIVE

## 2024-02-10 LAB — LIPASE, BLOOD: Lipase: 32 U/L (ref 11–51)

## 2024-02-10 NOTE — ED Triage Notes (Signed)
 Pt here with abd pain that started 50 mins ago. Pt states pain is centered around her umbilicus. Pt endorses nausea but denies V/D.

## 2024-02-10 NOTE — ED Provider Notes (Signed)
 [facility] Provider Note    Event Date/Time   First MD Initiated Contact with Patient 02/10/24 [phone]     (approximate)   History   Abdominal Pain   HPI  Ariana Hall is a 43 y.o. female who presents with complaints of abdominal pain.  Patient describes several episodes over the last year of periumbilical abdominal pain, each of these times there seems to be a bulging in her bellybutton and it feels firm.  When she lies down this seems to improve and the pain resolves.  Similar situation today, her pain is resolved here in the emergency department     Physical Exam   Triage Vital Signs: ED Triage Vitals  Encounter Vitals Group     BP 02/10/24 0940 (!) 126/52     Systolic BP Percentile --      Diastolic BP Percentile --      Pulse Rate 02/10/24 0940 (!) 51     Resp 02/10/24 0940 16     Temp 02/10/24 0940 98 F (36.7 C)     Temp Source 02/10/24 0940 Oral     SpO2 02/10/24 0940 98 %     Weight 02/10/24 0939 52.2 kg (115 lb 1.3 oz)     Height 02/10/24 0939 1.473 m (4\' 10" )     Head Circumference --      Peak Flow --      Pain Score 02/10/24 0939 8     Pain Loc --      Pain Education --      Exclude from Growth Chart --     Most recent vital signs: Vitals:   02/10/24 0940 02/10/24 1106  BP: (!) 126/52 95/73  Pulse: (!) 51 62  Resp: 16 15  Temp: 98 F (36.7 C) 98.2 F (36.8 C)  SpO2: 98% 100%     General: Awake, no distress.  CV:  Good peripheral perfusion.  Resp:  Normal effort.  Abd:  No distention.  Soft, nontender, no evidence of umbilical hernia at this time Other:     ED Results / Procedures / Treatments   Labs (all labs ordered are listed, but only abnormal results are displayed) Labs Reviewed  COMPREHENSIVE METABOLIC PANEL - Abnormal; Notable for the following components:      Result Value   Glucose, Bld 118 (*)    Calcium 8.6 (*)    All other components within normal limits  CBC - Abnormal; Notable for the  following components:   HCT 35.8 (*)    All other components within normal limits  URINALYSIS, ROUTINE W REFLEX MICROSCOPIC - Abnormal; Notable for the following components:   Color, Urine YELLOW (*)    APPearance HAZY (*)    Hgb urine dipstick SMALL (*)    Bacteria, UA RARE (*)    All other components within normal limits  LIPASE, BLOOD  POC URINE PREG, ED     EKG     RADIOLOGY     PROCEDURES:  Critical Care performed:   Procedures   MEDICATIONS ORDERED IN ED: Medications - No data to display   IMPRESSION / MDM / ASSESSMENT AND PLAN / ED COURSE  I reviewed the triage vital signs and the nursing notes. Patient's presentation is most consistent with acute presentation with potential threat to life or bodily function.  Patient presents with abdominal pain as detailed above.  Differential includes UTI, diverticulitis, gastritis, gastroenteritis, hernia  Given her HPI suspicious for hernia pain, symptoms have  resolved here, lab work obtained which is quite reassuring,  Patient is asymptomatic, will refer to general surgery for further evaluation and consideration of hernia repair        FINAL CLINICAL IMPRESSION(S) / ED DIAGNOSES   Final diagnoses:  Umbilical hernia without obstruction and without gangrene  Periumbilical abdominal pain     Rx / DC Orders   ED Discharge Orders     None        Note:  This document was prepared using Dragon voice recognition software and may include unintentional dictation errors.   Jene Every, MD 02/10/24 1155

## 2024-02-11 ENCOUNTER — Ambulatory Visit (INDEPENDENT_AMBULATORY_CARE_PROVIDER_SITE_OTHER): Admitting: General Surgery

## 2024-02-11 ENCOUNTER — Encounter: Payer: Self-pay | Admitting: General Surgery

## 2024-02-11 ENCOUNTER — Ambulatory Visit: Payer: Self-pay | Admitting: General Surgery

## 2024-02-11 VITALS — BP 90/54 | HR 67 | Temp 98.2°F | Ht 59.0 in | Wt 124.0 lb

## 2024-02-11 DIAGNOSIS — K429 Umbilical hernia without obstruction or gangrene: Secondary | ICD-10-CM

## 2024-02-11 NOTE — Patient Instructions (Signed)
You have requested for your Umbilical Hernia be repaired. This will be scheduled with Dr. Maurine Minister at [facility].   Please see your (blue)pre-care sheet for information. Our surgery scheduler will call you to verify surgery date and to go over information.   You will need to arrange to be off work for 1-2 weeks but will have to have a lifting restriction of no more than 15 lbs for 6 weeks following your surgery. If you have FMLA or disability paperwork that needs filled out you may drop this off at our office or this can be faxed to [phone].     Umbilical Hernia, Adult A hernia is a bulge of tissue that pushes through an opening between muscles. An umbilical hernia happens in the abdomen, near the belly button (umbilicus). The hernia may contain tissues from the small intestine, large intestine, or fatty tissue covering the intestines (omentum). Umbilical hernias in adults tend to get worse over time, and they require surgical treatment. There are several types of umbilical hernias. You may have: A hernia located just above or below the umbilicus (indirect hernia). This is the most common type of umbilical hernia in adults. A hernia that forms through an opening formed by the umbilicus (direct hernia). A hernia that comes and goes (reducible hernia). A reducible hernia may be visible only when you strain, lift something heavy, or cough. This type of hernia can be pushed back into the abdomen (reduced). A hernia that traps abdominal tissue inside the hernia (incarcerated hernia). This type of hernia cannot be reduced. A hernia that cuts off blood flow to the tissues inside the hernia (strangulated hernia). The tissues can start to die if this happens. This type of hernia requires emergency treatment.  What are the causes? An umbilical hernia happens when tissue inside the abdomen presses on a weak area of the abdominal muscles. What increases the risk? You may have a  greater risk of this condition if you: Are obese. Have had several pregnancies. Have a buildup of fluid inside your abdomen (ascites). Have had surgery that weakens the abdominal muscles.  What are the signs or symptoms? The main symptom of this condition is a painless bulge at or near the belly button. A reducible hernia may be visible only when you strain, lift something heavy, or cough. Other symptoms may include: Dull pain. A feeling of pressure.  Symptoms of a strangulated hernia may include: Pain that gets increasingly worse. Nausea and vomiting. Pain when pressing on the hernia. Skin over the hernia becoming red or purple. Constipation. Blood in the stool.  How is this diagnosed? This condition may be diagnosed based on: A physical exam. You may be asked to cough or strain while standing. These actions increase the pressure inside your abdomen and force the hernia through the opening in your muscles. Your health care provider may try to reduce the hernia by pressing on it. Your symptoms and medical history.  How is this treated? Surgery is the only treatment for an umbilical hernia. Surgery for a strangulated hernia is done as soon as possible. If you have a small hernia that is not incarcerated, you may need to lose weight before having surgery. Follow these instructions at home: Lose weight, if told by your health care provider. Do not try to push the hernia back in. Watch your hernia for any changes in color or size. Tell your health care provider if any changes occur. You may need to avoid activities  that increase pressure on your hernia. Do not lift anything that is heavier than 10 lb (4.5 kg) until your health care provider says that this is safe. Take over-the-counter and prescription medicines only as told by your health care provider. Keep all follow-up visits as told by your health care provider. This is important. Contact a health care provider if: Your hernia  gets larger. Your hernia becomes painful. Get help right away if: You develop sudden, severe pain near the area of your hernia. You have pain as well as nausea or vomiting. You have pain and the skin over your hernia changes color. You develop a fever. This information is not intended to replace advice given to you by your health care provider. Make sure you discuss any questions you have with your health care provider. Document Released: 04/20/2016 Document Revised: 07/22/2016 Document Reviewed: 04/20/2016 Elsevier Interactive Patient Education  [employer].

## 2024-02-11 NOTE — Progress Notes (Signed)
 Patient ID: Ariana Hall, female   DOB: 05/19/1981, 43 y.o.   MRN: [id number] CC: Umbilical Hernia History of Present Illness Ariana Hall is a 43 y.o. female with past medical history significant for gestational diabetes who presents in consultation for umbilical hernia.  The patient reports that she in the past she noticed a bulge in her umbilicus.  She did not have any pain at this and so did not think it was anything.  She says that this have been worse when she was pregnant with her second child.  However, she reports that this weekend she developed intense abdominal pain right at her umbilicus when this bulge appeared.  The pain was so severe she presented to the emergency department.  On evaluation of the emergency department she had reduction of the hernia and the pain subsided.  She is seeing me in clinic to discuss umbilical hernia repair.  She denies any obstructive symptoms including no nausea or vomiting.  She is having normal bowel movements.  She denies any overlying skin changes but does say that she will see a bulge from time to time.  She has never had any ventral or umbilical hernia repairs.   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  Past Medical History Past Medical History:  Diagnosis Date   Herpes genitalis    History of gestational diabetes    History of urinary tract infection    during pregnancy   No known health problems        Past Surgical History:  Procedure Laterality Date   CESAREAN SECTION  2020   CESAREAN SECTION WITH BILATERAL TUBAL LIGATION N/A 07/20/2021   TUBAL LIGATION      No Known Allergies  No current outpatient medications on file.   No current facility-administered medications for this visit.    Family History Family History  Problem Relation Age of Onset   Thrombosis Paternal Grandmother    Hypotension Mother    Clotting disorder Mother    Ovarian cancer Maternal Aunt    Breast cancer Neg Hx     Diabetes Neg Hx    Hypertension Neg Hx    Miscarriages / Stillbirths Neg Hx        Social History Social History   Tobacco Use   Smoking status: Never    Passive exposure: Current   Smokeless tobacco: Never   Tobacco comments:    denies secondhand smoke  Vaping Use   Vaping status: Never Used  Substance Use Topics   Alcohol use: Not Currently    Comment: Last ETOH many years ago.   Drug use: Never        ROS Full ROS of systems performed and is otherwise negative there than what is stated in the HPI  Physical Exam Blood pressure (!) 90/54, pulse 67, temperature 98.2 F (36.8 C), height 4\' 11"  (1.499 m), weight 124 lb (56.2 kg), last menstrual period 02/03/2024, SpO2 98%.  Alert and oriented x 3, clear auscultation bilaterally, regular rate and rhythm, abdomen is soft, obese, diastases with contracting abdominal muscles.  At the umbilicus she has an easily reducible hernia that seems to measure approximately 1 cm.  Moving all extremities spontaneously, mood and affect appropriate  Data Reviewed ED visit reviewed and significant for presentation for hernia pain and the pain subsided with reduction of the hernia.  Labs significant for normal creatinine and without leukocytosis or anemia at time of ED visit yesterday.  I have personally reviewed  the patient's imaging and medical records.    Assessment    43 year old woman with small easily reducible umbilical hernia that is causing her pain  Plan    We will plan for open umbilical hernia repair.  I discussed risk, benefits alternatives of the procedure including risk of infection, bleeding damage to underlying viscera as well as hernia recurrence.  I also discussed with the patient and her husband that if the hernia appears to be bigger at time of surgery then we may need to use a piece of mesh.  They understand these risks and wished to proceed.    Kandis Cocking 02/11/2024, 4:02 PM

## 2024-02-12 ENCOUNTER — Telehealth: Payer: Self-pay | Admitting: General Surgery

## 2024-02-12 NOTE — Telephone Encounter (Signed)
 Patient has been advised of Pre-Admission date/time, and Surgery date at [facility].  Surgery Date: 02/17/24 Preadmission Testing Date: 02/13/24 (phone 8a-1p)  Patient has been made aware to call [phone], between 1-3:00pm the day before surgery, to find out what time to arrive for surgery.

## 2024-02-13 ENCOUNTER — Other Ambulatory Visit: Payer: Self-pay

## 2024-02-13 ENCOUNTER — Encounter
Admission: RE | Admit: 2024-02-13 | Discharge: 2024-02-13 | Disposition: A | Discharge status: Home / Self Care | Source: Ambulatory Visit | Attending: General Surgery | Admitting: General Surgery

## 2024-02-13 VITALS — Ht 59.0 in | Wt 124.0 lb

## 2024-02-13 DIAGNOSIS — Z01818 Encounter for other preprocedural examination: Secondary | ICD-10-CM

## 2024-02-13 NOTE — Patient Instructions (Addendum)
 Your procedure is scheduled on: Monday 02/17/24 To find out your arrival time, please call [phone] between 1PM - 3PM on:  Friday 02/14/24  Report to the Registration Desk on the 1st floor of the [facility]. FREE Valet parking is available.  If your arrival time is 6:00 am, do not arrive before that time as the [facility] entrance doors do not open until 6:00 am.  REMEMBER: Instructions that are not followed completely may result in serious medical risk, up to and including death; or upon the discretion of your surgeon and anesthesiologist your surgery may need to be rescheduled.  Do not eat food or drink any liquids after midnight the night before surgery.  No gum chewing or hard candies.  One week prior to surgery: Stop Anti-inflammatories (NSAIDS) such as Advil, Aleve, Ibuprofen, Motrin, Naproxen, Naprosyn and Aspirin based products such as Excedrin, Goody's Powder, BC Powder. You may however, continue to take Tylenol if needed for pain up until the day of surgery.  Stop ANY OVER THE COUNTER supplements and vitamins until after surgery.  TAKE ONLY THESE MEDICATIONS THE MORNING OF SURGERY WITH A SIP OF WATER:  none  No Alcohol for 24 hours before or after surgery.  No Smoking including e-cigarettes for 24 hours before surgery.  No chewable tobacco products for at least 6 hours before surgery.  No nicotine patches on the day of surgery.  Do not use any "recreational" drugs for at least a week (preferably 2 weeks) before your surgery.  Please be advised that the combination of cocaine and anesthesia may have negative outcomes, up to and including death. If you test positive for cocaine, your surgery will be cancelled.  On the morning of surgery brush your teeth with toothpaste and water, you may rinse your mouth with mouthwash if you wish. Do not swallow any toothpaste or mouthwash.  Use CHG Soap or wipes as directed on instruction sheet. Shower the morning of surgery with  "Dial" soap if available  Do not wear lotions, powders, or perfumes.   Do not shave body hair from the neck down 48 hours before surgery.  Wear comfortable clothing (specific to your surgery type) to the hospital.  Do not wear jewelry, make-up, hairpins, clips or nail polish.  For welded (permanent) jewelry: bracelets, anklets, waist bands, etc.  Please have this removed prior to surgery.  If it is not removed, there is a chance that hospital personnel will need to cut it off on the day of surgery. Contact lenses, hearing aids and dentures may not be worn into surgery.  Do not bring valuables to the hospital. [facility] is not responsible for any missing/lost belongings or valuables.   Notify your doctor if there is any change in your medical condition (cold, fever, infection).  If you are being discharged the day of surgery, you will not be allowed to drive home. You will need a responsible individual to drive you home and stay with you for 24 hours after surgery.   If you are taking public transportation, you will need to have a responsible individual with you.  If you are being admitted to the hospital overnight, leave your suitcase in the car. After surgery it may be brought to your room.  In case of increased patient census, it may be necessary for you, the patient, to continue your postoperative care in the Same Day Surgery department.  After surgery, you can help prevent lung complications by doing breathing exercises.  Take deep  breaths and cough every 1-2 hours. Your doctor may order a device called an Incentive Spirometer to help you take deep breaths. When coughing or sneezing, hold a pillow firmly against your incision with both hands. This is called "splinting." Doing this helps protect your incision. It also decreases belly discomfort.  Surgery Visitation Policy:  Patients undergoing a surgery or procedure may have two family members or support persons with them as long  as the person is not COVID-19 positive or experiencing its symptoms.   Inpatient Visitation:    Visiting hours are 7 a.m. to 8 p.m. Up to four visitors are allowed at one time in a patient room. The visitors may rotate out with other people during the day. One designated support person (adult) may remain overnight.  Due to an increase in RSV and influenza rates and associated hospitalizations, children ages 56 and under will not be able to visit patients in [facility]. Masks continue to be strongly recommended.  Please call the Pre-admissions Testing Dept. at [phone] if you have any questions about these instructions.

## 2024-02-16 ENCOUNTER — Encounter: Payer: Self-pay | Admitting: General Surgery

## 2024-02-16 MED ORDER — CEFAZOLIN SODIUM-DEXTROSE 2-4 GM/100ML-% IV SOLN
2.0000 g | INTRAVENOUS | Status: AC
Start: 2024-02-17 — End: 2024-02-18
  Administered 2024-02-17: 2 g via INTRAVENOUS

## 2024-02-16 MED ORDER — CHLORHEXIDINE GLUCONATE CLOTH 2 % EX PADS
6.0000 | MEDICATED_PAD | Freq: Once | CUTANEOUS | Status: AC
  Administered 2024-02-17: 6 via TOPICAL

## 2024-02-16 MED ORDER — LACTATED RINGERS IV SOLN: INTRAVENOUS | Status: DC

## 2024-02-16 MED ORDER — CHLORHEXIDINE GLUCONATE 0.12 % MT SOLN
15.0000 mL | Freq: Once | OROMUCOSAL | Status: AC
  Administered 2024-02-17: 15 mL via OROMUCOSAL

## 2024-02-16 MED ORDER — CHLORHEXIDINE GLUCONATE CLOTH 2 % EX PADS: 6.0000 | MEDICATED_PAD | Freq: Once | CUTANEOUS | Status: DC

## 2024-02-16 MED ORDER — ORAL CARE MOUTH RINSE: 15.0000 mL | Freq: Once | OROMUCOSAL | Status: AC

## 2024-02-17 ENCOUNTER — Other Ambulatory Visit: Payer: Self-pay

## 2024-02-17 ENCOUNTER — Ambulatory Visit: Admitting: General Practice

## 2024-02-17 ENCOUNTER — Encounter
Admission: RE | Disposition: A | Discharge status: Home / Self Care | Payer: Self-pay | Source: Home / Self Care | Attending: General Surgery

## 2024-02-17 ENCOUNTER — Encounter: Payer: Self-pay | Admitting: General Surgery

## 2024-02-17 ENCOUNTER — Ambulatory Visit
Admission: RE | Admit: 2024-02-17 | Discharge: 2024-02-17 | Disposition: A | Discharge status: Home / Self Care | Attending: General Surgery | Admitting: General Surgery

## 2024-02-17 DIAGNOSIS — Z01818 Encounter for other preprocedural examination: Secondary | ICD-10-CM

## 2024-02-17 DIAGNOSIS — K429 Umbilical hernia without obstruction or gangrene: Secondary | ICD-10-CM | POA: Insufficient documentation

## 2024-02-17 HISTORY — PX: UMBILICAL HERNIA REPAIR: SHX196

## 2024-02-17 LAB — POCT PREGNANCY, URINE: Preg Test, Ur: NEGATIVE

## 2024-02-17 SURGERY — REPAIR, HERNIA, UMBILICAL, ADULT
Anesthesia: General | Site: Abdomen

## 2024-02-17 MED ORDER — PROPOFOL 10 MG/ML IV BOLUS
INTRAVENOUS | Status: DC | PRN
  Administered 2024-02-17: 150 mg via INTRAVENOUS

## 2024-02-17 MED ORDER — SUGAMMADEX SODIUM 200 MG/2ML IV SOLN
INTRAVENOUS | Status: DC | PRN
  Administered 2024-02-17: 224.8 mg via INTRAVENOUS

## 2024-02-17 MED ORDER — PHENYLEPHRINE 80 MCG/ML (10ML) SYRINGE FOR IV PUSH (FOR BLOOD PRESSURE SUPPORT)
PREFILLED_SYRINGE | INTRAVENOUS | Status: DC | PRN
  Administered 2024-02-17: 80 ug via INTRAVENOUS

## 2024-02-17 MED ORDER — BUPIVACAINE-EPINEPHRINE (PF) 0.5% -1:200000 IJ SOLN
INTRAMUSCULAR | Status: AC
  Filled 2024-02-17: qty 10

## 2024-02-17 MED ORDER — DEXMEDETOMIDINE HCL IN NACL 80 MCG/20ML IV SOLN
INTRAVENOUS | Status: AC
  Filled 2024-02-17: qty 20

## 2024-02-17 MED ORDER — EPHEDRINE SULFATE-NACL 50-0.9 MG/10ML-% IV SOSY
PREFILLED_SYRINGE | INTRAVENOUS | Status: DC | PRN
Start: 2024-02-17 — End: 2024-02-17
  Administered 2024-02-17: 5 mg via INTRAVENOUS

## 2024-02-17 MED ORDER — DEXAMETHASONE SODIUM PHOSPHATE 10 MG/ML IJ SOLN
INTRAMUSCULAR | Status: DC | PRN
  Administered 2024-02-17: 10 mg via INTRAVENOUS

## 2024-02-17 MED ORDER — MIDAZOLAM HCL 2 MG/2ML IJ SOLN
INTRAMUSCULAR | Status: AC
  Filled 2024-02-17: qty 2

## 2024-02-17 MED ORDER — OXYCODONE HCL 5 MG PO TABS
ORAL_TABLET | ORAL | Status: AC
  Filled 2024-02-17: qty 1

## 2024-02-17 MED ORDER — OXYCODONE HCL 5 MG PO TABS
5.0000 mg | ORAL_TABLET | Freq: Once | ORAL | Status: AC | PRN
  Administered 2024-02-17: 5 mg via ORAL

## 2024-02-17 MED ORDER — FENTANYL CITRATE (PF) 100 MCG/2ML IJ SOLN
25.0000 ug | INTRAMUSCULAR | Status: DC | PRN
Start: 2024-02-17 — End: 2024-02-17

## 2024-02-17 MED ORDER — ROCURONIUM BROMIDE 100 MG/10ML IV SOLN
INTRAVENOUS | Status: DC | PRN
  Administered 2024-02-17: 50 mg via INTRAVENOUS

## 2024-02-17 MED ORDER — ONDANSETRON HCL 4 MG/2ML IJ SOLN
INTRAMUSCULAR | Status: DC | PRN
  Administered 2024-02-17: 4 mg via INTRAVENOUS

## 2024-02-17 MED ORDER — KETOROLAC TROMETHAMINE 30 MG/ML IJ SOLN
30.0000 mg | Freq: Once | INTRAMUSCULAR | Status: AC
  Administered 2024-02-17: 30 mg via INTRAVENOUS

## 2024-02-17 MED ORDER — MIDAZOLAM HCL 2 MG/2ML IJ SOLN
INTRAMUSCULAR | Status: DC | PRN
  Administered 2024-02-17: 2 mg via INTRAVENOUS

## 2024-02-17 MED ORDER — PROPOFOL 10 MG/ML IV BOLUS
INTRAVENOUS | Status: AC
  Filled 2024-02-17: qty 20

## 2024-02-17 MED ORDER — 0.9 % SODIUM CHLORIDE (POUR BTL) OPTIME
TOPICAL | Status: DC | PRN
  Administered 2024-02-17: 200 mL
  Administered 2024-02-17: 500 mL

## 2024-02-17 MED ORDER — ROCURONIUM BROMIDE 10 MG/ML (PF) SYRINGE
PREFILLED_SYRINGE | INTRAVENOUS | Status: AC
  Filled 2024-02-17: qty 10

## 2024-02-17 MED ORDER — FENTANYL CITRATE (PF) 100 MCG/2ML IJ SOLN
INTRAMUSCULAR | Status: DC | PRN
Start: 2024-02-17 — End: 2024-02-17
  Administered 2024-02-17 (×2): 50 ug via INTRAVENOUS

## 2024-02-17 MED ORDER — DEXAMETHASONE SODIUM PHOSPHATE 10 MG/ML IJ SOLN
INTRAMUSCULAR | Status: AC
  Filled 2024-02-17: qty 1

## 2024-02-17 MED ORDER — ONDANSETRON HCL 4 MG/2ML IJ SOLN
INTRAMUSCULAR | Status: AC
  Filled 2024-02-17: qty 2

## 2024-02-17 MED ORDER — LIDOCAINE HCL (PF) 2 % IJ SOLN
INTRAMUSCULAR | Status: AC
  Filled 2024-02-17: qty 5

## 2024-02-17 MED ORDER — ACETAMINOPHEN 10 MG/ML IV SOLN
INTRAVENOUS | Status: DC | PRN
  Administered 2024-02-17: 1000 mg via INTRAVENOUS

## 2024-02-17 MED ORDER — BUPIVACAINE LIPOSOME 1.3 % IJ SUSP
INTRAMUSCULAR | Status: AC
  Filled 2024-02-17: qty 10

## 2024-02-17 MED ORDER — BUPIVACAINE-EPINEPHRINE 0.5% -1:200000 IJ SOLN
INTRAMUSCULAR | Status: DC | PRN
  Administered 2024-02-17: 20 mL via SURGICAL_CAVITY

## 2024-02-17 MED ORDER — KETOROLAC TROMETHAMINE 30 MG/ML IJ SOLN
INTRAMUSCULAR | Status: AC
  Filled 2024-02-17: qty 1

## 2024-02-17 MED ORDER — CHLORHEXIDINE GLUCONATE 0.12 % MT SOLN
OROMUCOSAL | Status: AC
  Filled 2024-02-17: qty 15

## 2024-02-17 MED ORDER — SUGAMMADEX SODIUM 200 MG/2ML IV SOLN
INTRAVENOUS | Status: AC
  Filled 2024-02-17: qty 2

## 2024-02-17 MED ORDER — ACETAMINOPHEN 10 MG/ML IV SOLN
INTRAVENOUS | Status: AC
  Filled 2024-02-17: qty 100

## 2024-02-17 MED ORDER — CEFAZOLIN SODIUM-DEXTROSE 2-4 GM/100ML-% IV SOLN
INTRAVENOUS | Status: AC
  Filled 2024-02-17: qty 100

## 2024-02-17 MED ORDER — OXYCODONE HCL 5 MG/5ML PO SOLN: 5.0000 mg | Freq: Once | ORAL | Status: AC | PRN

## 2024-02-17 MED ORDER — OXYCODONE HCL 5 MG PO TABS: 5.0000 mg | ORAL_TABLET | Freq: Four times a day (QID) | ORAL | 0 refills | Status: DC | PRN

## 2024-02-17 MED ORDER — LIDOCAINE HCL (CARDIAC) PF 100 MG/5ML IV SOSY
PREFILLED_SYRINGE | INTRAVENOUS | Status: DC | PRN
  Administered 2024-02-17: 70 mg via INTRAVENOUS

## 2024-02-17 MED ORDER — FENTANYL CITRATE (PF) 100 MCG/2ML IJ SOLN
INTRAMUSCULAR | Status: AC
  Filled 2024-02-17: qty 2

## 2024-02-17 SURGICAL SUPPLY — 33 items
BLADE SURG 15 STRL LF DISP TIS (BLADE) ×1 IMPLANT
BLADE SURG SZ10 CARB STEEL (BLADE) IMPLANT
CHLORAPREP W/TINT 26 (MISCELLANEOUS) IMPLANT
DERMABOND ADVANCED .7 DNX12 (GAUZE/BANDAGES/DRESSINGS) IMPLANT
DRAPE LAPAROTOMY 100X77 ABD (DRAPES) ×1 IMPLANT
DRSG OPSITE POSTOP 4X10 (GAUZE/BANDAGES/DRESSINGS) IMPLANT
DRSG OPSITE POSTOP 4X8 (GAUZE/BANDAGES/DRESSINGS) IMPLANT
ELECT BLADE 6.5 EXT (BLADE) IMPLANT
ELECT REM PT RETURN 9FT ADLT (ELECTROSURGICAL) ×1 IMPLANT
ELECTRODE REM PT RTRN 9FT ADLT (ELECTROSURGICAL) ×1 IMPLANT
GLOVE BIOGEL PI IND STRL 7.5 (GLOVE) ×1 IMPLANT
GLOVE SURG SYN 7.0 (GLOVE) ×1 IMPLANT
GLOVE SURG SYN 7.0 PF PI (GLOVE) ×1 IMPLANT
GOWN STRL REUS W/ TWL LRG LVL3 (GOWN DISPOSABLE) ×2 IMPLANT
HOLSTER ELECTROSUGICAL PENCIL (MISCELLANEOUS) IMPLANT
KIT TURNOVER KIT A (KITS) ×1 IMPLANT
LABEL OR SOLS (LABEL) ×1 IMPLANT
MANIFOLD NEPTUNE II (INSTRUMENTS) ×1 IMPLANT
NDL HYPO 22X1.5 SAFETY MO (MISCELLANEOUS) ×1 IMPLANT
NEEDLE HYPO 22X1.5 SAFETY MO (MISCELLANEOUS) ×1 IMPLANT
NS IRRIG 500ML POUR BTL (IV SOLUTION) ×1 IMPLANT
PACK BASIN MINOR ARMC (MISCELLANEOUS) ×1 IMPLANT
SPONGE T-LAP 18X18 ~~LOC~~+RFID (SPONGE) ×1 IMPLANT
STAPLER SKIN PROX 35W (STAPLE) IMPLANT
SUT ETHIBOND 0 MO6 C/R (SUTURE) IMPLANT
SUT MNCRL 4-0 27 PS-2 XMFL (SUTURE) ×1 IMPLANT
SUT PDS PLUS AB 0 CT-2 (SUTURE) ×2 IMPLANT
SUT VIC AB 3-0 SH 27X BRD (SUTURE) ×1 IMPLANT
SUTURE MNCRL 4-0 27XMF (SUTURE) ×1 IMPLANT
SYR 10ML LL (SYRINGE) ×1 IMPLANT
SYR 20ML LL LF (SYRINGE) IMPLANT
TRAP FLUID SMOKE EVACUATOR (MISCELLANEOUS) ×1 IMPLANT
WATER STERILE IRR 500ML POUR (IV SOLUTION) ×1 IMPLANT

## 2024-02-17 NOTE — Anesthesia Preprocedure Evaluation (Signed)
 Anesthesia Evaluation  Patient identified by MRN, date of birth, ID band Patient awake    Reviewed: Allergy & Precautions, NPO status , Patient's Chart, lab work & pertinent test results  Airway Mallampati: III  TM Distance: >3 FB Neck ROM: full    Dental  (+) Dental Advidsory Given, Teeth Intact   Pulmonary neg pulmonary ROS   Pulmonary exam normal        Cardiovascular negative cardio ROS Normal cardiovascular exam     Neuro/Psych negative neurological ROS  negative psych ROS   GI/Hepatic negative GI ROS, Neg liver ROS,,,  Endo/Other  negative endocrine ROS    Renal/GU      Musculoskeletal   Abdominal   Peds  Hematology negative hematology ROS (+)   Anesthesia Other Findings Past Medical History: No date: Herpes genitalis No date: History of gestational diabetes No date: History of urinary tract infection     Comment:  during pregnancy No date: No known health problems  Past Surgical History: 2020: CESAREAN SECTION 07/20/2021: CESAREAN SECTION WITH BILATERAL TUBAL LIGATION; N/A No date: TUBAL LIGATION  BMI    Body Mass Index: 25.04 kg/m      Reproductive/Obstetrics negative OB ROS                             Anesthesia Physical Anesthesia Plan  ASA: 1  Anesthesia Plan: General ETT   Post-op Pain Management:    Induction: Intravenous  PONV Risk Score and Plan: Ondansetron, Dexamethasone, Midazolam and Treatment may vary due to age or medical condition  Airway Management Planned: Oral ETT  Additional Equipment:   Intra-op Plan:   Post-operative Plan: Extubation in OR  Informed Consent: I have reviewed the patients History and Physical, chart, labs and discussed the procedure including the risks, benefits and alternatives for the proposed anesthesia with the patient or authorized representative who has indicated his/her understanding and acceptance.     Dental  Advisory Given  Plan Discussed with: Anesthesiologist, CRNA and Surgeon  Anesthesia Plan Comments: (Patient consented for risks of anesthesia including but not limited to:  - adverse reactions to medications - damage to eyes, teeth, lips or other oral mucosa - nerve damage due to positioning  - sore throat or hoarseness - Damage to heart, brain, nerves, lungs, other parts of body or loss of life  Patient voiced understanding and assent.)       Anesthesia Quick Evaluation

## 2024-02-17 NOTE — Transfer of Care (Signed)
 Immediate Anesthesia Transfer of Care Note  Patient: Ariana Hall  Procedure(s) Performed: Procedure(s) with comments: REPAIR, HERNIA, UMBILICAL, ADULT (N/A) - Open with possible mesh  Patient Location: PACU  Anesthesia Type:General  Level of Consciousness: sedated  Airway & Oxygen Therapy: Patient Spontanous Breathing and Patient connected to face mask oxygen  Post-op Assessment: Report given to RN and Post -op Vital signs reviewed and stable  Post vital signs: Reviewed and stable  Last Vitals:  Vitals:   02/17/24 0647 02/17/24 0841  BP: (!) 99/46 111/60  Pulse: 63 89  Resp: 16 14  Temp: 36.6 C 36.5 C  SpO2: 100% 100%    Complications: No apparent anesthesia complications

## 2024-02-17 NOTE — Op Note (Signed)
 Operative Note  Preoperative diagnosis: Umbilical hernia, reducible Postoperative diagnosis: Umbilical hernia measuring approximately 1.5 cm, reducible Surgeon: Baker Pierini, MD Procedure: Open repair of umbilical hernia EBL: 10 cc  After informed consent was obtained the patient was brought to the operating room placed supine on the operating room table.  General endotracheal anesthesia was induced and her abdomen was then prepped and draped in usual sterile fashion.  A surgical timeout was called identifying correct patient, site, side and procedure.  A curvilinear incision was made inferior to the umbilicus.  This was taken down through the subcutaneous tissue with Bovie cautery.  The fascia was identified and there was a small hole at the base of the umbilicus.  To better cleared off the fascia of the umbilical stalk this was dissected around and the umbilical stalk was freed from the fascia with Bovie cautery.  The fascial defect measured approximately 1.5 cm.  The sac was easily reducible and the fascial edges were cleared to allow for appropriate closure of the defect.  The sac was reduced into the abdomen and the hernia defect was then closed with several 0 Ethibond sutures in an interrupted fashion.  The umbilical stalk was then tacked down to the fascia with a 3-0 Vicryl.  The subcutaneous tissue was then irrigated and hemostasis was obtained.  The subcutaneous tissue was then infiltrated with 20 cc of liposomal bupivacaine and Marcaine solution.  The deep dermal layer was then closed with 3-0 Vicryl and the skin was then closed with 4-0 Monocryl and dressed with glue.  The patient tolerated the procedure well and prior to termination of the procedure all sponge and instrument counts were correct x 2.  The patient was then awoken from general endotracheal anesthesia and transferred the PACU in good condition.

## 2024-02-17 NOTE — Anesthesia Postprocedure Evaluation (Signed)
 Anesthesia Post Note  Patient: Ariana Hall  Procedure(s) Performed: REPAIR, HERNIA, UMBILICAL, ADULT (Abdomen)  Patient location during evaluation: PACU Anesthesia Type: General Level of consciousness: awake and alert Pain management: pain level controlled Vital Signs Assessment: post-procedure vital signs reviewed and stable Respiratory status: spontaneous breathing, nonlabored ventilation, respiratory function stable and patient connected to nasal cannula oxygen Cardiovascular status: blood pressure returned to baseline and stable Postop Assessment: no apparent nausea or vomiting Anesthetic complications: no  No notable events documented.   Last Vitals:  Vitals:   02/17/24 0916 02/17/24 0928  BP: 106/64 119/74  Pulse: 68 65  Resp: 16 14  Temp:  36.6 C  SpO2: 100% 100%    Last Pain:  Vitals:   02/17/24 0928  TempSrc: Temporal  PainSc: 2                  Stephanie Coup

## 2024-02-17 NOTE — H&P (Signed)
 No changes to below H and  P, proceed with open umbilical hernia repair, possible mesh use. All r/b/a discussed with patient again.   CC: Umbilical Hernia History of Present Illness Ariana Hall is a 43 y.o. female with past medical history significant for gestational diabetes who presents in consultation for umbilical hernia.  The patient reports that she in the past she noticed a bulge in her umbilicus.  She did not have any pain at this and so did not think it was anything.  She says that this have been worse when she was pregnant with her second child.  However, she reports that this weekend she developed intense abdominal pain right at her umbilicus when this bulge appeared.  The pain was so severe she presented to the emergency department.  On evaluation of the emergency department she had reduction of the hernia and the pain subsided.  She is seeing me in clinic to discuss umbilical hernia repair.  She denies any obstructive symptoms including no nausea or vomiting.  She is having normal bowel movements.  She denies any overlying skin changes but does say that she will see a bulge from time to time.  She has never had any ventral or umbilical hernia repairs.     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   Past Medical History     Past Medical History:  Diagnosis Date   Herpes genitalis     History of gestational diabetes     History of urinary tract infection      during pregnancy   No known health problems                   Past Surgical History:  Procedure Laterality Date   CESAREAN SECTION   2020   CESAREAN SECTION WITH BILATERAL TUBAL LIGATION N/A 07/20/2021   TUBAL LIGATION              Allergies  No Known Allergies     No current outpatient medications on file.      No current facility-administered medications for this visit.        Family History      Family History  Problem Relation Age of Onset   Thrombosis Paternal Grandmother      Hypotension Mother     Clotting disorder Mother     Ovarian cancer Maternal Aunt     Breast cancer Neg Hx     Diabetes Neg Hx     Hypertension Neg Hx     Miscarriages / Stillbirths Neg Hx              Social History Social History  Social History         Tobacco Use   Smoking status: Never      Passive exposure: Current   Smokeless tobacco: Never   Tobacco comments:      denies secondhand smoke  Vaping Use   Vaping status: Never Used  Substance Use Topics   Alcohol use: Not Currently      Comment: Last ETOH many years ago.   Drug use: Never            ROS Full ROS of systems performed and is otherwise negative there than what is stated in the HPI   Physical Exam Blood pressure (!) 90/54, pulse 67, temperature 98.2 F (36.8 C), height 4\' 11"  (1.499 m), weight 124 lb (56.2 kg), last menstrual period 02/03/2024, SpO2 98%.  Alert and oriented x 3, clear auscultation bilaterally, regular rate and rhythm, abdomen is soft, obese, diastases with contracting abdominal muscles.  At the umbilicus she has an easily reducible hernia that seems to measure approximately 1 cm.  Moving all extremities spontaneously, mood and affect appropriate   Data Reviewed ED visit reviewed and significant for presentation for hernia pain and the pain subsided with reduction of the hernia.  Labs significant for normal creatinine and without leukocytosis or anemia at time of ED visit yesterday.   I have personally reviewed the patient's imaging and medical records.     Assessment Assessment 43 year old woman with small easily reducible umbilical hernia that is causing her pain   Plan Plan We will plan for open umbilical hernia repair.  I discussed risk, benefits alternatives of the procedure including risk of infection, bleeding damage to underlying viscera as well as hernia recurrence.  I also discussed with the patient and her husband that if the hernia appears to be bigger at time of  surgery then we may need to use a piece of mesh.  They understand these risks and wished to proceed.

## 2024-02-17 NOTE — Progress Notes (Signed)
 Patient provided work note from Dr. Maurine Minister.

## 2024-02-17 NOTE — Anesthesia Procedure Notes (Signed)
 Procedure Name: Intubation Date/Time: 02/17/2024 7:32 AM  Performed by: Stormy Fabian, CRNAPre-anesthesia Checklist: Patient identified, Patient being monitored, Timeout performed, Emergency Drugs available and Suction available Patient Re-evaluated:Patient Re-evaluated prior to induction Oxygen Delivery Method: Circle system utilized Preoxygenation: Pre-oxygenation with 100% oxygen Induction Type: IV induction Ventilation: Mask ventilation without difficulty Laryngoscope Size: Mac and 3 Grade View: Grade I Tube type: Oral Tube size: 6.5 mm Number of attempts: 1 Airway Equipment and Method: Stylet Placement Confirmation: ETT inserted through vocal cords under direct vision, positive ETCO2 and breath sounds checked- equal and bilateral Secured at: 19 cm Tube secured with: Tape Dental Injury: Teeth and Oropharynx as per pre-operative assessment

## 2024-02-18 ENCOUNTER — Encounter: Payer: Self-pay | Admitting: General Surgery

## 2024-03-03 ENCOUNTER — Encounter: Payer: Self-pay | Admitting: General Surgery

## 2024-03-03 ENCOUNTER — Ambulatory Visit (INDEPENDENT_AMBULATORY_CARE_PROVIDER_SITE_OTHER): Admitting: General Surgery

## 2024-03-03 VITALS — BP 100/66 | HR 65 | Temp 98.9°F | Ht 59.0 in | Wt 121.0 lb

## 2024-03-03 DIAGNOSIS — K429 Umbilical hernia without obstruction or gangrene: Secondary | ICD-10-CM

## 2024-03-03 NOTE — Patient Instructions (Signed)

## 2024-03-03 NOTE — Progress Notes (Signed)
 Outpatient Surgical Follow Up  03/03/2024  Ariana Hall is an 43 y.o. female.   Chief Complaint  Patient presents with   Routine Post Op    Umbilical hernia surgery 02/17/24     HPI: Patient returns today status post umbilical hernia repair.  She reports doing well.  She denies any fevers or chills.  She denies any drainage from the incision.  She is tolerating a diet and having normal bowel function.  She denies any bulge at the area.  Past Medical History:  Diagnosis Date   Herpes genitalis    History of gestational diabetes    History of urinary tract infection    during pregnancy   No known health problems     Past Surgical History:  Procedure Laterality Date   CESAREAN SECTION  2020   CESAREAN SECTION WITH BILATERAL TUBAL LIGATION N/A 07/20/2021   TUBAL LIGATION     UMBILICAL HERNIA REPAIR N/A 02/17/2024   Procedure: REPAIR, HERNIA, UMBILICAL, ADULT;  Surgeon: Kandis Cocking, MD;  Location: [facility] ORS;  Service: General;  Laterality: N/A;  Open with possible mesh    Family History  Problem Relation Age of Onset   Thrombosis Paternal Grandmother    Hypotension Mother    Clotting disorder Mother    Ovarian cancer Maternal Aunt    Breast cancer Neg Hx    Diabetes Neg Hx    Hypertension Neg Hx    Miscarriages / Stillbirths Neg Hx     Social History:  reports that she has never smoked. She has been exposed to tobacco smoke. She has never used smokeless tobacco. She reports that she does not currently use alcohol. She reports that she does not use drugs.  Allergies: No Known Allergies  Medications reviewed.    ROS Full ROS performed and is otherwise negative other than what is stated in HPI   BP 100/66   Pulse 65   Temp 98.9 F (37.2 C) (Oral)   Ht 4\' 11"  (1.499 m)   Wt 121 lb (54.9 kg)   LMP 02/03/2024 (Exact Date)   SpO2 100%   BMI 24.44 kg/m   Physical Exam Abdomen is soft, nontender nondistended.  Infraumbilical incision is with glue on it.   There is a little bit of dried blood.  No drainage.  The glue is starting to peel off.  No evidence of recurrence of hernia.    No results found for this or any previous visit (from the past 48 hours). No results found.  Assessment/Plan:  Patient status post umbilical hernia repair.  Doing well.  No acute postoperative complications.  I discussed with her continue lifting restrictions for another 4 weeks including no lifting greater than 10 to 15 pounds.  She understands this and she can follow-up with [place] on an as-needed basis   Baker Pierini, M.D. [facility]
